# Patient Record
Sex: Male | Born: 1960
Health system: Southern US, Community
[De-identification: ages and names within clinical notes are randomized; demographics above are authoritative.]

## PROBLEM LIST (undated history)

## (undated) DIAGNOSIS — E7800 Pure hypercholesterolemia, unspecified: Secondary | ICD-10-CM

## (undated) DIAGNOSIS — I10 Essential (primary) hypertension: Secondary | ICD-10-CM

## (undated) DIAGNOSIS — E119 Type 2 diabetes mellitus without complications: Secondary | ICD-10-CM

## (undated) DIAGNOSIS — R7881 Bacteremia: Secondary | ICD-10-CM

## (undated) DIAGNOSIS — M25569 Pain in unspecified knee: Secondary | ICD-10-CM

## (undated) HISTORY — PX: WRIST FRACTURE SURGERY: SHX121

## (undated) HISTORY — PX: APPENDECTOMY: SHX54

---
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
## 2013-03-29 ENCOUNTER — Emergency Department (HOSPITAL_COMMUNITY)
Admission: EM | Admit: 2013-03-29 | Discharge: 2013-03-29 | Disposition: A | Discharge status: Home / Self Care | Payer: Self-pay | Attending: Emergency Medicine | Admitting: Emergency Medicine

## 2013-03-29 ENCOUNTER — Emergency Department (HOSPITAL_COMMUNITY): Payer: Self-pay

## 2013-03-29 ENCOUNTER — Encounter (HOSPITAL_COMMUNITY): Payer: Self-pay | Admitting: Emergency Medicine

## 2013-03-29 DIAGNOSIS — S43429A Sprain of unspecified rotator cuff capsule, initial encounter: Secondary | ICD-10-CM | POA: Insufficient documentation

## 2013-03-29 DIAGNOSIS — R079 Chest pain, unspecified: Secondary | ICD-10-CM

## 2013-03-29 DIAGNOSIS — E119 Type 2 diabetes mellitus without complications: Secondary | ICD-10-CM | POA: Insufficient documentation

## 2013-03-29 DIAGNOSIS — Y939 Activity, unspecified: Secondary | ICD-10-CM | POA: Insufficient documentation

## 2013-03-29 DIAGNOSIS — I10 Essential (primary) hypertension: Secondary | ICD-10-CM | POA: Insufficient documentation

## 2013-03-29 DIAGNOSIS — Y929 Unspecified place or not applicable: Secondary | ICD-10-CM | POA: Insufficient documentation

## 2013-03-29 DIAGNOSIS — K458 Other specified abdominal hernia without obstruction or gangrene: Secondary | ICD-10-CM | POA: Insufficient documentation

## 2013-03-29 DIAGNOSIS — X58XXXA Exposure to other specified factors, initial encounter: Secondary | ICD-10-CM | POA: Insufficient documentation

## 2013-03-29 DIAGNOSIS — Z87891 Personal history of nicotine dependence: Secondary | ICD-10-CM | POA: Insufficient documentation

## 2013-03-29 HISTORY — DX: Essential (primary) hypertension: I10

## 2013-03-29 HISTORY — DX: Pure hypercholesterolemia, unspecified: E78.00

## 2013-03-29 HISTORY — DX: Type 2 diabetes mellitus without complications: E11.9

## 2013-03-29 LAB — COMPREHENSIVE METABOLIC PANEL
ALBUMIN: 4.8 g/dL (ref 3.5–5.2)
ALT: 50 U/L (ref 0–53)
AST: 44 U/L — ABNORMAL HIGH (ref 0–37)
Alkaline Phosphatase: 79 U/L (ref 39–117)
BILIRUBIN TOTAL: 0.6 mg/dL (ref 0.3–1.2)
BUN: 10 mg/dL (ref 6–23)
CHLORIDE: 95 meq/L — AB (ref 96–112)
CO2: 23 mEq/L (ref 19–32)
Calcium: 9.5 mg/dL (ref 8.4–10.5)
Creatinine, Ser: 0.66 mg/dL (ref 0.50–1.35)
GFR calc non Af Amer: >90 mL/min (ref >90)
GLUCOSE: 221 mg/dL — AB (ref 70–99)
Potassium: 3.8 mEq/L (ref 3.7–5.3)
SODIUM: 135 meq/L — AB (ref 137–147)
Total Protein: 7.9 g/dL (ref 6.0–8.3)

## 2013-03-29 LAB — CBC WITH DIFFERENTIAL/PLATELET
BASOS ABS: 0 10*3/uL (ref 0.0–0.1)
BASOS PCT: 0 % (ref 0–1)
EOS PCT: 0 % (ref 0–5)
Eosinophils Absolute: 0 10*3/uL (ref 0.0–0.7)
HCT: 40.4 % (ref 39.0–52.0)
Hemoglobin: 14.6 g/dL (ref 13.0–17.0)
LYMPHS PCT: 11 % — AB (ref 12–46)
Lymphs Abs: 1.2 10*3/uL (ref 0.7–4.0)
MCH: 31.7 pg (ref 26.0–34.0)
MCHC: 36.1 g/dL — ABNORMAL HIGH (ref 30.0–36.0)
MCV: 87.8 fL (ref 78.0–100.0)
MONO ABS: 0.6 10*3/uL (ref 0.1–1.0)
Monocytes Relative: 5 % (ref 3–12)
Neutro Abs: 8.9 10*3/uL — ABNORMAL HIGH (ref 1.7–7.7)
Neutrophils Relative %: 83 % — ABNORMAL HIGH (ref 43–77)
Platelets: 204 10*3/uL (ref 150–400)
RBC: 4.6 MIL/uL (ref 4.22–5.81)
RDW: 12.3 % (ref 11.5–15.5)
WBC: 10.7 10*3/uL — AB (ref 4.0–10.5)

## 2013-03-29 LAB — POCT I-STAT TROPONIN I
Troponin i, poc: 0.01 ng/mL (ref 0.00–0.08)
Troponin i, poc: 0 ng/mL (ref 0.00–0.08)

## 2013-03-29 MED ORDER — ASPIRIN 81 MG PO CHEW
324.0000 mg | CHEWABLE_TABLET | Freq: Once | ORAL | Status: AC
  Administered 2013-03-29: 324 mg via ORAL
  Filled 2013-03-29: qty 4

## 2013-03-29 MED ORDER — IBUPROFEN 800 MG PO TABS: 800.0000 mg | ORAL_TABLET | Freq: Three times a day (TID) | ORAL | Status: DC

## 2013-03-29 MED ORDER — MORPHINE SULFATE 4 MG/ML IJ SOLN
4.0000 mg | Freq: Once | INTRAMUSCULAR | Status: AC
  Administered 2013-03-29: 4 mg via INTRAVENOUS
  Filled 2013-03-29: qty 1

## 2013-03-29 NOTE — ED Notes (Signed)
Attempted to draw istat troponin. Unsuccessful- notified phlebotomy.

## 2013-03-29 NOTE — ED Provider Notes (Signed)
CSN: [id number]     Arrival date & time 03/29/13  [id number] History   First MD Initiated Contact with Patient 03/29/13 [phone]     Chief Complaint  Patient presents with  . Chest Pain   (Consider location/radiation/quality/duration/timing/severity/associated sxs/prior Treatment) HPI Comments: Patient presents to the emergency department with chief complaint of chest pain that started this morning when he awoke around 6:00. States the pain is located on left side of his chest. He denies any associated shortness of breath, or diaphoresis. He states the pain was an 8/10, but is now 6/10. The pain is worsened with movement, and with palpation, but he denies any known mechanism of injury. Cardiac risk factors include hyperlipidemia, diabetes, hypertension, and family history, and smoking history. He states the pain has eased off since this morning. He states that he has had pain like this before, but it has been "quite a while." He reports having a stress test approximately 15 years ago. Additionally, he reports that he has had an abdominal hernia for several years, and also a rotator cuff injury. States that the shoulder causes him pain, and the hernia is a nuisance, but does not cause pain.  The history is provided by the patient. No language interpreter was used.    Past Medical History  Diagnosis Date  . Hypercholesteremia   . Diabetes mellitus without complication   . Hypertension    Past Surgical History  Procedure Laterality Date  . Appendectomy    . Wrist fracture surgery     History reviewed. No pertinent family history. History  Substance Use Topics  . Smoking status: Former Games developermoker  . Smokeless tobacco: Not on file  . Alcohol Use: Yes    Review of Systems  All other systems reviewed and are negative.    Allergies  Review of patient's allergies indicates no known allergies.  Home Medications  No current outpatient prescriptions on file. BP 162/106  Temp(Src) 97.7 F (36.5 C)  (Oral)  Resp 18  SpO2 98% Physical Exam  Nursing note and vitals reviewed. Constitutional: He is oriented to person, place, and time. He appears well-developed and well-nourished.  HENT:  Head: Normocephalic and atraumatic.  Eyes: Conjunctivae and EOM are normal. Pupils are equal, round, and reactive to light. Right eye exhibits no discharge. Left eye exhibits no discharge. No scleral icterus.  Neck: Normal range of motion. Neck supple. No JVD present.  Cardiovascular: Normal rate, regular rhythm, normal heart sounds and intact distal pulses.  Exam reveals no gallop and no friction rub.   No murmur heard. Pulmonary/Chest: Effort normal and breath sounds normal. No respiratory distress. He has no wheezes. He has no rales. He exhibits no tenderness.  Abdominal: Soft. He exhibits no distension and no mass. There is no tenderness. There is no rebound and no guarding.  Right lower quadrant remarkable for small abdominal hernia  Musculoskeletal: Normal range of motion. He exhibits no edema and no tenderness.  Neurological: He is alert and oriented to person, place, and time.  CN 3-12 intact  Skin: Skin is warm and dry.  Psychiatric: He has a normal mood and affect. His behavior is normal. Judgment and thought content normal.    ED Course  Procedures (including critical care time) Labs Review Labs Reviewed  COMPREHENSIVE METABOLIC PANEL - Abnormal; Notable for the following:    Sodium 135 (*)    Chloride 95 (*)    Glucose, Bld 221 (*)    AST 44 (*)    All other  components within normal limits  CBC WITH DIFFERENTIAL - Abnormal; Notable for the following:    WBC 10.7 (*)    MCHC 36.1 (*)    Neutrophils Relative % 83 (*)    Neutro Abs 8.9 (*)    Lymphocytes Relative 11 (*)    All other components within normal limits  POCT I-STAT TROPONIN I   Imaging Review No results found.  EKG Interpretation    Date/Time:  Tuesday March 29 2013 06:44:37 EST Ventricular Rate:  102 PR  Interval:  146 QRS Duration: 92 QT Interval:  346 QTC Calculation: 450 R Axis:   12 Text Interpretation:  Sinus tachycardia Otherwise normal ECG No old tracing to compare Confirmed by [facility]  MD, TREY ([id number]) on 03/29/2013 11:27:45 AM          12:30 PM ED ECG REPORT  I personally interpreted this EKG   Date: 03/29/2013   Rate: 72  Rhythm: normal sinus rhythm  QRS Axis: normal  Intervals: normal  ST/T Wave abnormalities: normal  Conduction Disutrbances:none  Narrative Interpretation:   Old EKG Reviewed: changes noted, tachycardia is resolved    MDM   1. Chest pain     Patient with chest pain. Initial labs are normal. Will treat with aspirin and morphine. Will check chest x-ray. Abdominal hernia and rotator cuff can't be treated on an outpatient basis. I discussed this with the patient.   12:31 PM  Labs are reassuring.  HEART score is 3.  Wells PE score is 1.5, doubt PE.  Pain is reproducible with palpation.  Believe this to be chest wall pain.  Delta troponin and repeat EKG are normal.  Discharge to home with cardiology follow-up.  Patient seen by and discussed with Dr. Loretha Stapler, who agrees with the plan.   Roxy Horseman, PA-C 03/29/13 1248

## 2013-03-29 NOTE — ED Notes (Signed)
Pt having chest pain 6/10. Denies SOB, dizziness, N/V

## 2013-03-29 NOTE — ED Notes (Addendum)
C/o CP, onset ~ 2 hrs ago, h/o htn, DM, hypercholesterol, denies other sx, "feels like a knot or a cramp", pinpoints to L chest w/o radiation. No meds PTA. Suppose to be on medicines, but do not have any. Not taking any meds.

## 2013-03-29 NOTE — ED Provider Notes (Signed)
Medical screening examination/treatment/procedure(s) were conducted as a shared visit with non-physician practitioner(s) and myself.  I personally evaluated the patient during the encounter.   Please see my separate note.     Candyce ChurnJohn David Vessie Olmsted, MD 03/29/13 [phone]

## 2013-03-29 NOTE — Discharge Instructions (Signed)
Chest Pain (Nonspecific) °It is often hard to give a specific diagnosis for the cause of chest pain. There is always a chance that your pain could be related to something serious, such as a heart attack or a blood clot in the lungs. You need to follow up with your caregiver for further evaluation. °CAUSES  °· Heartburn. °· Pneumonia or bronchitis. °· Anxiety or stress. °· Inflammation around your heart (pericarditis) or lung (pleuritis or pleurisy). °· A blood clot in the lung. °· A collapsed lung (pneumothorax). It can develop suddenly on its own (spontaneous pneumothorax) or from injury (trauma) to the chest. °· Shingles infection (herpes zoster virus). °The chest wall is composed of bones, muscles, and cartilage. Any of these can be the source of the pain. °· The bones can be bruised by injury. °· The muscles or cartilage can be strained by coughing or overwork. °· The cartilage can be affected by inflammation and become sore (costochondritis). °DIAGNOSIS  °Lab tests or other studies, such as X-rays, electrocardiography, stress testing, or cardiac imaging, may be needed to find the cause of your pain.  °TREATMENT  °· Treatment depends on what may be causing your chest pain. Treatment may include: °· Acid blockers for heartburn. °· Anti-inflammatory medicine. °· Pain medicine for inflammatory conditions. °· Antibiotics if an infection is present. °· You may be advised to change lifestyle habits. This includes stopping smoking and avoiding alcohol, caffeine, and chocolate. °· You may be advised to keep your head raised (elevated) when sleeping. This reduces the chance of acid going backward from your stomach into your esophagus. °· Most of the time, nonspecific chest pain will improve within 2 to 3 days with rest and mild pain medicine. °HOME CARE INSTRUCTIONS  °· If antibiotics were prescribed, take your antibiotics as directed. Finish them even if you start to feel better. °· For the next few days, avoid physical  activities that bring on chest pain. Continue physical activities as directed. °· Do not smoke. °· Avoid drinking alcohol. °· Only take over-the-counter or prescription medicine for pain, discomfort, or fever as directed by your caregiver. °· Follow your caregiver's suggestions for further testing if your chest pain does not go away. °· Keep any follow-up appointments you made. If you do not go to an appointment, you could develop lasting (chronic) problems with pain. If there is any problem keeping an appointment, you must call to reschedule. °SEEK MEDICAL CARE IF:  °· You think you are having problems from the medicine you are taking. Read your medicine instructions carefully. °· Your chest pain does not go away, even after treatment. °· You develop a rash with blisters on your chest. °SEEK IMMEDIATE MEDICAL CARE IF:  °· You have increased chest pain or pain that spreads to your arm, neck, jaw, back, or abdomen. °· You develop shortness of breath, an increasing cough, or you are coughing up blood. °· You have severe back or abdominal pain, feel nauseous, or vomit. °· You develop severe weakness, fainting, or chills. °· You have a fever. °THIS IS AN EMERGENCY. Do not wait to see if the pain will go away. Get medical help at once. Call your local emergency services (911 in [place] Do not drive yourself to the hospital. °MAKE SURE YOU:  °· Understand these instructions. °· Will watch your condition. °· Will get help right away if you are not doing well or get worse. °Document Released: 12/18/2004 Document Revised: 06/02/2011 Document Reviewed: 10/14/2007 °Exit[employer]® Patient Information ©2014 [employer]   [employer]. ° °

## 2013-03-29 NOTE — ED Provider Notes (Signed)
Medical screening examination/treatment/procedure(s) were conducted as a shared visit with non-physician practitioner(s) and myself.  I personally evaluated the patient during the encounter.  EKG Interpretation    Date/Time:  Tuesday March 29 2013 06:44:37 EST Ventricular Rate:  102 PR Interval:  146 QRS Duration: 92 QT Interval:  346 QTC Calculation: 450 R Axis:   12 Text Interpretation:  Sinus tachycardia Otherwise normal ECG No old tracing to compare Conf[facility] by [facility]  [facility], TREY ([id number]) on 03/29/2013 11:27:45 AM            53 yo male with onset of chest pain this morning.  Central, non radiating.  Not associated with SOB, nausea, diaphoresis, or lightheadedness.  On exam, well appearing, no distress, chest tenderness to palpation and with sitting up from lying position.  Lungs CTAB.  Heart sounds normal with RRR.  Has risk factors for ACS, but story more concerning for chest wall pain. Plan to rule out MI with delta troponin and have him follow up with cardiology for further testing.    Clinical Impression: 1. Chest pain       Carl ChurnJohn David Jaquae Rieves, MD 03/29/13 [phone]

## 2013-07-27 ENCOUNTER — Encounter (HOSPITAL_COMMUNITY): Payer: Self-pay | Admitting: Emergency Medicine

## 2013-07-27 ENCOUNTER — Emergency Department (HOSPITAL_COMMUNITY)
Admission: EM | Admit: 2013-07-27 | Discharge: 2013-07-27 | Disposition: A | Discharge status: Home / Self Care | Payer: Self-pay | Attending: Emergency Medicine | Admitting: Emergency Medicine

## 2013-07-27 DIAGNOSIS — S0502XA Injury of conjunctiva and corneal abrasion without foreign body, left eye, initial encounter: Secondary | ICD-10-CM

## 2013-07-27 DIAGNOSIS — X58XXXA Exposure to other specified factors, initial encounter: Secondary | ICD-10-CM | POA: Insufficient documentation

## 2013-07-27 DIAGNOSIS — S0010XA Contusion of unspecified eyelid and periocular area, initial encounter: Secondary | ICD-10-CM | POA: Insufficient documentation

## 2013-07-27 DIAGNOSIS — S058X9A Other injuries of unspecified eye and orbit, initial encounter: Secondary | ICD-10-CM | POA: Insufficient documentation

## 2013-07-27 DIAGNOSIS — Y929 Unspecified place or not applicable: Secondary | ICD-10-CM | POA: Insufficient documentation

## 2013-07-27 DIAGNOSIS — I10 Essential (primary) hypertension: Secondary | ICD-10-CM | POA: Insufficient documentation

## 2013-07-27 DIAGNOSIS — Y939 Activity, unspecified: Secondary | ICD-10-CM | POA: Insufficient documentation

## 2013-07-27 DIAGNOSIS — Z87891 Personal history of nicotine dependence: Secondary | ICD-10-CM | POA: Insufficient documentation

## 2013-07-27 DIAGNOSIS — Z791 Long term (current) use of non-steroidal anti-inflammatories (NSAID): Secondary | ICD-10-CM | POA: Insufficient documentation

## 2013-07-27 DIAGNOSIS — E119 Type 2 diabetes mellitus without complications: Secondary | ICD-10-CM | POA: Insufficient documentation

## 2013-07-27 MED ORDER — FLUORESCEIN SODIUM 1 MG OP STRP
1.0000 | ORAL_STRIP | Freq: Once | OPHTHALMIC | Status: AC
  Administered 2013-07-27: 1 via OPHTHALMIC
  Filled 2013-07-27: qty 1

## 2013-07-27 MED ORDER — GENTAMICIN SULFATE 0.3 % OP SOLN: 1.0000 [drp] | OPHTHALMIC | Status: DC

## 2013-07-27 MED ORDER — PROPARACAINE HCL 0.5 % OP SOLN
1.0000 [drp] | Freq: Once | OPHTHALMIC | Status: AC
  Administered 2013-07-27: 1 [drp] via OPHTHALMIC
  Filled 2013-07-27: qty 15

## 2013-07-27 NOTE — ED Notes (Signed)
PA at bedside.

## 2013-07-27 NOTE — Discharge Instructions (Signed)
Take tylenol and ibuprofen as needed for pain. You may alternate cool and warm moist compresses to eye to help ease pain. Use antibiotic drops as prescribed until symptoms improve.  If not improving in 2-3 days, follow up with ophthalmologist for further evaluation and treatment.

## 2013-07-27 NOTE — ED Notes (Signed)
Pt reports feels like he maybe got a pain chip in his eye; feels it moving around. Conjuctiva is reddened in left eye.

## 2013-07-27 NOTE — ED Notes (Signed)
Per pt sts that he thinks he either got paint chip or dust in eye yesterday. Left eye. sts he can feel it in there and unable to get out.

## 2013-07-27 NOTE — ED Notes (Signed)
PT ambulated with baseline gait; VSS; A&Ox3; no signs of distress; respirations even and unlabored; skin warm and dry; no questions upon discharge.  

## 2013-07-27 NOTE — ED Provider Notes (Signed)
CSN: [id number]     Arrival date & time 07/27/13  1023 History   First MD Initiated Contact with Patient 07/27/13 1230    This chart was scribed for Junius FinnerErin O'Malley PA-C, a non-physician practitioner working with No att. providers found by Lewanda RifeAlexandra Hurtado, ED Scribe. This patient was seen in room TR04C/TR04C and the patient's care was started at 1:01 PM     Chief Complaint  Patient presents with  . Eye Problem     (Consider location/radiation/quality/duration/timing/severity/associated sxs/prior Treatment) The history is provided by the patient. No language interpreter was used.   HPI Comments: Carl Baker is a 53 y.o. male who presents to the Emergency Department complaining of constant left eye pain onset yesterday while at work. Describes pain as moderate in severity. States there is a possible paint chip in eye. Reports associated foreign body sensation. Reports trying to Visine with mild relief of symptoms. Denies any aggravating factors. Denies associated fever, and other injuries. Pt does not wear contacts.    Past Medical History  Diagnosis Date  . Hypercholesteremia   . Diabetes mellitus without complication   . Hypertension    Past Surgical History  Procedure Laterality Date  . Appendectomy    . Wrist fracture surgery     History reviewed. No pertinent family history. History  Substance Use Topics  . Smoking status: Former Games developermoker  . Smokeless tobacco: Not on file  . Alcohol Use: Yes    Review of Systems  Constitutional: Negative for fever.  Eyes: Positive for pain, discharge and redness. Negative for photophobia.  Psychiatric/Behavioral: Negative for confusion.      Allergies  Review of patient's allergies indicates no known allergies.  Home Medications   Prior to Admission medications   Medication Sig Start Date End Date Taking? Authorizing Provider  ibuprofen (ADVIL,MOTRIN) 800 MG tablet Take 1 tablet (800 mg total) by mouth 3 (three) times daily.  03/29/13   Roxy Horsemanobert Browning, PA-C  Multiple Vitamins-Minerals (CENTRUM SILVER ADULT 50+) TABS Take 1 tablet by mouth daily.    Historical Provider, MD   BP 132/80  Pulse 79  Temp(Src) 98.3 F (36.8 C)  Resp 20  Wt 240 lb (108.863 kg)  SpO2 99% Physical Exam  Nursing note and vitals reviewed. Constitutional: He is oriented to person, place, and time. He appears well-developed and well-nourished. No distress.  HENT:  Head: Normocephalic and atraumatic.  Eyes: EOM are normal. Pupils are equal, round, and reactive to light. Left eye exhibits no discharge. No foreign body present in the left eye. Left conjunctiva is injected.  Slit lamp exam:      The left eye shows corneal abrasion and fluorescein uptake. The left eye shows no corneal ulcer.    Visual Acuity Right Eye Distance: 20/40 Left Eye Distance: 20/40 Both eyes: 20/40  Neck: Neck supple. No tracheal deviation present.  Cardiovascular: Normal rate.   Pulmonary/Chest: Effort normal. No respiratory distress.  Musculoskeletal: Normal range of motion.  Neurological: He is alert and oriented to person, place, and time.  Skin: Skin is warm and dry.  Psychiatric: He has a normal mood and affect. His behavior is normal.    ED Course  Procedures (including critical care time) COORDINATION OF CARE:  Nursing notes reviewed. Vital signs reviewed. Initial pt interview and examination performed.   Filed Vitals:   07/27/13 1033 07/27/13 1316  BP: 138/78 132/80  Pulse: 79 79  Temp: 98.3 F (36.8 C)   Resp: 18 20  Weight: 240 lb (108.863  kg)   SpO2: 95% 99%    1:05 PM-Discussed work up plan with pt at bedside, which includes fluorescein stain and slit lamp. No orders of the defined types were placed in this encounter.  . Pt agrees with plan.   Treatment plan initiated: Medications  fluorescein ophthalmic strip 1 strip (1 strip Both Eyes Given 07/27/13 1301)  proparacaine (ALCAINE) 0.5 % ophthalmic solution 1 drop (1 drop Both Eyes  Given 07/27/13 1301)     Initial diagnostic testing ordered.       Labs Review Labs Reviewed - No data to display  Imaging Review No results found.   EKG Interpretation None      MDM   Final diagnoses:  Injury of conjunctiva and corneal abrasion of left eye w/o FB    Pt c/o FB sensation in eye, no FB seen on exam. Eye flushed. Will tx for corneal abrasion. Rx: gentamicin. Advised to f/u with ophthalmology if not improving in 2-3 days. Return precautions provided. Pt verbalized understanding and agreement with tx plan.   I personally performed the services described in this documentation, which was scribed in my presence. The recorded information has been reviewed and is accurate.    Junius Finnerrin O'Malley, PA-C 07/28/13 1008

## 2013-07-29 NOTE — ED Provider Notes (Signed)
Medical screening examination/treatment/procedure(s) were performed by non-physician practitioner and as supervising physician I was immediately available for consultation/collaboration.   EKG Interpretation None        Laray AngerKathleen M Malissa Slay, DO 07/29/13 2102

## 2018-10-08 ENCOUNTER — Encounter (HOSPITAL_COMMUNITY): Payer: Self-pay | Admitting: Emergency Medicine

## 2018-10-08 ENCOUNTER — Inpatient Hospital Stay (HOSPITAL_COMMUNITY)
Admission: EM | Admit: 2018-10-08 | Discharge: 2018-10-13 | DRG: 872 | Disposition: A | Discharge status: Home Health | Payer: Self-pay | Attending: Internal Medicine | Admitting: Internal Medicine

## 2018-10-08 ENCOUNTER — Emergency Department (HOSPITAL_COMMUNITY): Payer: Self-pay

## 2018-10-08 ENCOUNTER — Other Ambulatory Visit: Payer: Self-pay

## 2018-10-08 DIAGNOSIS — R7881 Bacteremia: Secondary | ICD-10-CM

## 2018-10-08 DIAGNOSIS — R159 Full incontinence of feces: Secondary | ICD-10-CM | POA: Diagnosis present

## 2018-10-08 DIAGNOSIS — Z79899 Other long term (current) drug therapy: Secondary | ICD-10-CM

## 2018-10-08 DIAGNOSIS — E119 Type 2 diabetes mellitus without complications: Secondary | ICD-10-CM

## 2018-10-08 DIAGNOSIS — M25569 Pain in unspecified knee: Secondary | ICD-10-CM

## 2018-10-08 DIAGNOSIS — Z20828 Contact with and (suspected) exposure to other viral communicable diseases: Secondary | ICD-10-CM | POA: Diagnosis present

## 2018-10-08 DIAGNOSIS — E1165 Type 2 diabetes mellitus with hyperglycemia: Secondary | ICD-10-CM | POA: Diagnosis present

## 2018-10-08 DIAGNOSIS — Z7984 Long term (current) use of oral hypoglycemic drugs: Secondary | ICD-10-CM

## 2018-10-08 DIAGNOSIS — Z87891 Personal history of nicotine dependence: Secondary | ICD-10-CM

## 2018-10-08 DIAGNOSIS — M609 Myositis, unspecified: Secondary | ICD-10-CM | POA: Diagnosis present

## 2018-10-08 DIAGNOSIS — I313 Pericardial effusion (noninflammatory): Secondary | ICD-10-CM | POA: Diagnosis present

## 2018-10-08 DIAGNOSIS — E785 Hyperlipidemia, unspecified: Secondary | ICD-10-CM | POA: Diagnosis present

## 2018-10-08 DIAGNOSIS — N410 Acute prostatitis: Secondary | ICD-10-CM | POA: Diagnosis present

## 2018-10-08 DIAGNOSIS — Z9114 Patient's other noncompliance with medication regimen: Secondary | ICD-10-CM

## 2018-10-08 DIAGNOSIS — N401 Enlarged prostate with lower urinary tract symptoms: Secondary | ICD-10-CM | POA: Diagnosis present

## 2018-10-08 DIAGNOSIS — N3001 Acute cystitis with hematuria: Secondary | ICD-10-CM | POA: Diagnosis present

## 2018-10-08 DIAGNOSIS — S83281A Other tear of lateral meniscus, current injury, right knee, initial encounter: Secondary | ICD-10-CM | POA: Diagnosis present

## 2018-10-08 DIAGNOSIS — E860 Dehydration: Secondary | ICD-10-CM | POA: Diagnosis present

## 2018-10-08 DIAGNOSIS — B9562 Methicillin resistant Staphylococcus aureus infection as the cause of diseases classified elsewhere: Secondary | ICD-10-CM

## 2018-10-08 DIAGNOSIS — A4102 Sepsis due to Methicillin resistant Staphylococcus aureus: Principal | ICD-10-CM | POA: Diagnosis present

## 2018-10-08 DIAGNOSIS — I10 Essential (primary) hypertension: Secondary | ICD-10-CM | POA: Diagnosis present

## 2018-10-08 DIAGNOSIS — R824 Acetonuria: Secondary | ICD-10-CM | POA: Diagnosis present

## 2018-10-08 DIAGNOSIS — K6289 Other specified diseases of anus and rectum: Secondary | ICD-10-CM | POA: Diagnosis present

## 2018-10-08 DIAGNOSIS — R338 Other retention of urine: Secondary | ICD-10-CM | POA: Diagnosis present

## 2018-10-08 DIAGNOSIS — E871 Hypo-osmolality and hyponatremia: Secondary | ICD-10-CM | POA: Diagnosis present

## 2018-10-08 DIAGNOSIS — E7800 Pure hypercholesterolemia, unspecified: Secondary | ICD-10-CM | POA: Diagnosis present

## 2018-10-08 LAB — COMPREHENSIVE METABOLIC PANEL
ALT: 22 U/L (ref 0–44)
AST: 15 U/L (ref 15–41)
Albumin: 2.5 g/dL — ABNORMAL LOW (ref 3.5–5.0)
Alkaline Phosphatase: 100 U/L (ref 38–126)
Anion gap: 13 (ref 5–15)
BUN: 12 mg/dL (ref 6–20)
CO2: 19 mmol/L — ABNORMAL LOW (ref 22–32)
Calcium: 8.8 mg/dL — ABNORMAL LOW (ref 8.9–10.3)
Chloride: 96 mmol/L — ABNORMAL LOW (ref 98–111)
Creatinine, Ser: 0.89 mg/dL (ref 0.61–1.24)
GFR calc Af Amer: >60 mL/min (ref >60)
GFR calc non Af Amer: >60 mL/min (ref >60)
Glucose, Bld: 437 mg/dL — ABNORMAL HIGH (ref 70–99)
Potassium: 4.3 mmol/L (ref 3.5–5.1)
Sodium: 128 mmol/L — ABNORMAL LOW (ref 135–145)
Total Bilirubin: 0.9 mg/dL (ref 0.3–1.2)
Total Protein: 6.8 g/dL (ref 6.5–8.1)

## 2018-10-08 LAB — HEMOGLOBIN A1C
Hgb A1c MFr Bld: 12.8 % — ABNORMAL HIGH (ref 4.8–5.6)
Mean Plasma Glucose: 320.66 mg/dL

## 2018-10-08 LAB — CBC WITH DIFFERENTIAL/PLATELET
Abs Immature Granulocytes: 0.27 10*3/uL — ABNORMAL HIGH (ref 0.00–0.07)
Basophils Absolute: 0.1 10*3/uL (ref 0.0–0.1)
Basophils Relative: 0 %
Eosinophils Absolute: 0 10*3/uL (ref 0.0–0.5)
Eosinophils Relative: 0 %
HCT: 43.1 % (ref 39.0–52.0)
Hemoglobin: 14.4 g/dL (ref 13.0–17.0)
Immature Granulocytes: 1 %
Lymphocytes Relative: 4 %
Lymphs Abs: 1.1 10*3/uL (ref 0.7–4.0)
MCH: 29.8 pg (ref 26.0–34.0)
MCHC: 33.4 g/dL (ref 30.0–36.0)
MCV: 89.2 fL (ref 80.0–100.0)
Monocytes Absolute: 1.2 10*3/uL — ABNORMAL HIGH (ref 0.1–1.0)
Monocytes Relative: 5 %
Neutro Abs: 22.7 10*3/uL — ABNORMAL HIGH (ref 1.7–7.7)
Neutrophils Relative %: 90 %
Platelets: 266 10*3/uL (ref 150–400)
RBC: 4.83 MIL/uL (ref 4.22–5.81)
RDW: 12.4 % (ref 11.5–15.5)
WBC: 25.4 10*3/uL — ABNORMAL HIGH (ref 4.0–10.5)
nRBC: 0 % (ref 0.0–0.2)

## 2018-10-08 LAB — GLUCOSE, CAPILLARY
Glucose-Capillary: 305 mg/dL — ABNORMAL HIGH (ref 70–99)
Glucose-Capillary: 291 mg/dL — ABNORMAL HIGH (ref 70–99)

## 2018-10-08 LAB — URINALYSIS, ROUTINE W REFLEX MICROSCOPIC
Bacteria, UA: NONE SEEN
Bilirubin Urine: NEGATIVE
Glucose, UA: >=500 mg/dL — AB
Ketones, ur: 80 mg/dL — AB
Nitrite: NEGATIVE
Protein, ur: 30 mg/dL — AB
Specific Gravity, Urine: 1.028 (ref 1.005–1.030)
pH: 6 (ref 5.0–8.0)

## 2018-10-08 LAB — SARS CORONAVIRUS 2 BY RT PCR (HOSPITAL ORDER, PERFORMED IN ~~LOC~~ HOSPITAL LAB): SARS Coronavirus 2: NEGATIVE

## 2018-10-08 LAB — LACTIC ACID, PLASMA: Lactic Acid, Venous: 1.7 mmol/L (ref 0.5–1.9)

## 2018-10-08 MED ORDER — SODIUM CHLORIDE 0.9 % IV SOLN: 2.0000 g | INTRAVENOUS | Status: DC

## 2018-10-08 MED ORDER — ACETAMINOPHEN 650 MG RE SUPP: 650.0000 mg | Freq: Four times a day (QID) | RECTAL | Status: DC | PRN

## 2018-10-08 MED ORDER — OXYCODONE HCL 5 MG PO TABS
5.0000 mg | ORAL_TABLET | ORAL | Status: DC | PRN
  Administered 2018-10-08 – 2018-10-13 (×12): 5 mg via ORAL
  Filled 2018-10-08 (×14): qty 1

## 2018-10-08 MED ORDER — SODIUM CHLORIDE 0.9 % IV SOLN
INTRAVENOUS | Status: DC
  Administered 2018-10-08 – 2018-10-10 (×6): via INTRAVENOUS

## 2018-10-08 MED ORDER — HYDROMORPHONE HCL 1 MG/ML IJ SOLN
1.0000 mg | Freq: Once | INTRAMUSCULAR | Status: AC
  Administered 2018-10-08: 12:00:00 1 mg via INTRAVENOUS
  Filled 2018-10-08: qty 1

## 2018-10-08 MED ORDER — ONDANSETRON HCL 4 MG PO TABS: 4.0000 mg | ORAL_TABLET | Freq: Four times a day (QID) | ORAL | Status: DC | PRN

## 2018-10-08 MED ORDER — ONDANSETRON HCL 4 MG/2ML IJ SOLN: 4.0000 mg | Freq: Four times a day (QID) | INTRAMUSCULAR | Status: DC | PRN

## 2018-10-08 MED ORDER — SODIUM CHLORIDE 0.9 % IV BOLUS
1000.0000 mL | Freq: Once | INTRAVENOUS | Status: AC
  Administered 2018-10-08: 14:00:00 1000 mL via INTRAVENOUS

## 2018-10-08 MED ORDER — HYDROMORPHONE HCL 1 MG/ML IJ SOLN
1.0000 mg | Freq: Once | INTRAMUSCULAR | Status: AC
  Administered 2018-10-08: 14:00:00 1 mg via INTRAVENOUS
  Filled 2018-10-08: qty 1

## 2018-10-08 MED ORDER — INSULIN ASPART 100 UNIT/ML ~~LOC~~ SOLN
0.0000 [IU] | Freq: Every day | SUBCUTANEOUS | Status: DC
  Administered 2018-10-08 – 2018-10-09 (×2): 5 [IU] via SUBCUTANEOUS
  Administered 2018-10-12: 2 [IU] via SUBCUTANEOUS

## 2018-10-08 MED ORDER — ACETAMINOPHEN 325 MG PO TABS
650.0000 mg | ORAL_TABLET | Freq: Four times a day (QID) | ORAL | Status: DC | PRN
  Administered 2018-10-09 – 2018-10-11 (×2): 650 mg via ORAL
  Filled 2018-10-08 (×2): qty 2

## 2018-10-08 MED ORDER — LISINOPRIL 20 MG PO TABS
20.0000 mg | ORAL_TABLET | Freq: Every day | ORAL | Status: DC
  Administered 2018-10-08 – 2018-10-12 (×5): 20 mg via ORAL
  Filled 2018-10-08 (×5): qty 1

## 2018-10-08 MED ORDER — KETOROLAC TROMETHAMINE 30 MG/ML IJ SOLN
30.0000 mg | Freq: Four times a day (QID) | INTRAMUSCULAR | Status: DC | PRN
  Administered 2018-10-10 – 2018-10-12 (×5): 30 mg via INTRAVENOUS
  Filled 2018-10-08 (×6): qty 1

## 2018-10-08 MED ORDER — ENOXAPARIN SODIUM 40 MG/0.4ML ~~LOC~~ SOLN
40.0000 mg | SUBCUTANEOUS | Status: DC
  Administered 2018-10-08 – 2018-10-12 (×5): 40 mg via SUBCUTANEOUS
  Filled 2018-10-08 (×5): qty 0.4

## 2018-10-08 MED ORDER — SODIUM CHLORIDE 0.9 % IV SOLN
2.0000 g | Freq: Once | INTRAVENOUS | Status: AC
  Administered 2018-10-08: 2 g via INTRAVENOUS
  Filled 2018-10-08: qty 20

## 2018-10-08 MED ORDER — INSULIN ASPART 100 UNIT/ML ~~LOC~~ SOLN
0.0000 [IU] | Freq: Three times a day (TID) | SUBCUTANEOUS | Status: DC
  Administered 2018-10-08: 11 [IU] via SUBCUTANEOUS
  Administered 2018-10-09: 5 [IU] via SUBCUTANEOUS
  Administered 2018-10-09 (×2): 11 [IU] via SUBCUTANEOUS
  Administered 2018-10-10: 5 [IU] via SUBCUTANEOUS
  Administered 2018-10-10: 8 [IU] via SUBCUTANEOUS
  Administered 2018-10-10: 5 [IU] via SUBCUTANEOUS
  Administered 2018-10-11 – 2018-10-12 (×4): 3 [IU] via SUBCUTANEOUS
  Administered 2018-10-12: 2 [IU] via SUBCUTANEOUS
  Administered 2018-10-13: 13:00:00 3 [IU] via SUBCUTANEOUS

## 2018-10-08 NOTE — Progress Notes (Signed)
Thelbert Gartin [id number]  Code Status: FULL Admission Data: 10/08/2018 7:47 PM  Attending Provider: Lorin Mercy  VEH:[id number], No Pcp Per  Consults/ Treatment Team:   Robben Jagiello is a 58 y.o. male patient admitted from ED awake, alert - oriented X 4 - no acute distress noted. VSS - Blood pressure (!) 144/77, pulse (!) 114, temperature 99.3 F (37.4 C), temperature source Oral, resp. rate 17, height 6\' 2"  (1.88 m), weight 105.2 kg, SpO2 97 %. no c/o shortness of breath, no c/o chest pain. Orientation to room, and floor completed with information packet given to patient/family. Admission INP armband ID verified with patient/family, and in place.  SR up x 2, fall assessment complete, with patient and family able to verbalize understanding of risk associated with falls, and verbalized understanding to call nsg before up out of bed. Call light within reach, patient able to voice, and demonstrate understanding. Skin, clean-dry- intact without evidence of bruising, or skin tears. Small bumps noted to both underarms. No evidence of skin break down noted on exam.  ?  Will cont to eval and treat per MD orders.  Melonie Florida, RN  10/08/2018 7:47 PM

## 2018-10-08 NOTE — ED Notes (Signed)
[place] pt contact.

## 2018-10-08 NOTE — ED Provider Notes (Signed)
[facility]GENCY [facility] Provider Note   CSN: [id number] Arrival date & time: 10/08/18  [id number]    History   Chief Complaint Chief Complaint  Patient presents with   Fall   Urinary Incontinence   Encopresis    HPI Carl Baker is a [age] y.o. male.     58 y.o male with a PMH of DM, HTN, Hypercholesteremia presents to the ED with a chief complaint of fall. Patient had a fall about 2 weeks ago, reports he hit his shin on the back of a truck causing him to spin around and landed on his buttocks region.  He reports pain to his right knee, worse with ambulation.  Also endorses pain along the left lower back reports the radiation of pain into his penis, states "like a bar wires on my penis when I urinate".  Patient was seen by his PCP 2 weeks ago, told there was some blood in his urine and prescribed Cipro which she has completed therapy.  He reports having some episodes of urinary incontinence, states he stands up to urinate but is unable to control himself, he also reports he has had some loose stools, states the pain is worse with urination.  He denies any fever, scrotal swelling, bowel incontinence.   The history is provided by the patient.    Past Medical History:  Diagnosis Date   Diabetes mellitus without complication (HCC)    Hypercholesteremia    Hypertension     There are no active problems to display for this patient.   Past Surgical History:  Procedure Laterality Date   APPENDECTOMY     WRIST FRACTURE SURGERY          Home Medications    Prior to Admission medications   Medication Sig Start Date End Date Taking? Authorizing Provider  acetaminophen (TYLENOL) 500 MG tablet Take 1,000 mg by mouth every 6 (six) hours as needed for mild pain.   Yes [provider]  ciprofloxacin (CIPRO) 500 MG tablet Take 500 mg by mouth 2 (two) times daily. 10/04/18  Yes [provider]  glimepiride (AMARYL) 4 MG tablet Take 4 mg by  mouth 2 (two) times daily. 10/04/18  Yes [provider]  lisinopril (ZESTRIL) 20 MG tablet Take 20 mg by mouth daily. 10/04/18  Yes [provider]  Multiple Vitamins-Minerals (CENTRUM SILVER ADULT 50+) TABS Take 1 tablet by mouth daily.   Yes [provider]  pioglitazone (ACTOS) 30 MG tablet Take 30 mg by mouth daily. 10/04/18  Yes [provider]  gentamicin (GARAMYCIN) 0.3 % ophthalmic solution Place 1 drop into the left eye every 4 (four) hours. Patient not taking: Reported on 10/08/2018 07/27/13   Lurene ShadowPhelps, Erin O, PA-C  ibuprofen (ADVIL,MOTRIN) 800 MG tablet Take 1 tablet (800 mg total) by mouth 3 (three) times daily. Patient not taking: Reported on 10/08/2018 03/29/13   Roxy HorsemanBrowning, Robert, PA-C    Family History No family history on file.  Social History Social History   Tobacco Use   Smoking status: Former Smoker  Substance Use Topics   Alcohol use: Yes   Drug use: Yes    Types: Marijuana     Allergies   Patient has no known allergies.   Review of Systems Review of Systems  Constitutional: Negative for chills and fever.  HENT: Negative for ear pain and sore throat.   Eyes: Negative for pain and visual disturbance.  Respiratory: Negative for cough and shortness of breath.  Cardiovascular: Negative for chest pain and palpitations.  Gastrointestinal: Negative for abdominal pain and vomiting.  Genitourinary: Negative for dysuria and hematuria.  Musculoskeletal: Positive for arthralgias, back pain and myalgias.  Skin: Negative for color change and rash.  Neurological: Negative for seizures and syncope.  All other systems reviewed and are negative.    Physical Exam Updated Vital Signs BP (!) 158/90    Pulse (!) 110    Temp 98.1 F (36.7 C) (Oral)    Resp 16    Ht 6\' 2"  (1.88 m)    Wt 105.2 kg    SpO2 96%    BMI 29.79 kg/m   Physical Exam Vitals signs and nursing note reviewed.  Constitutional:      Appearance: He is well-developed.    HENT:     Head: Normocephalic and atraumatic.  Eyes:     General: No scleral icterus.    Pupils: Pupils are equal, round, and reactive to light.  Neck:     Musculoskeletal: Normal range of motion.  Cardiovascular:     Heart sounds: Normal heart sounds.  Pulmonary:     Effort: Pulmonary effort is normal.     Breath sounds: Normal breath sounds. No wheezing.  Chest:     Chest wall: No tenderness.  Abdominal:     General: Bowel sounds are normal. There is no distension.     Palpations: Abdomen is soft.     Tenderness: There is no abdominal tenderness.  Musculoskeletal:        General: No tenderness or deformity.     Thoracic back: He exhibits pain and spasm.       Back:  Skin:    General: Skin is warm and dry.  Neurological:     Mental Status: He is alert and oriented to person, place, and time.     Comments: RLE- KF,KE 5/5 strength LLE- HF, HE 5/5 strength Normal gait. No pronator drift. No leg drop.  Patellar reflexes present and symmetric. CN I, II and VIII not tested. CN II-XII grossly intact bilaterally.         ED Treatments / Results  Labs (all labs ordered are listed, but only abnormal results are displayed) Labs Reviewed  CBC WITH DIFFERENTIAL/PLATELET - Abnormal; Notable for the following components:      Result Value   WBC 25.4 (*)    Neutro Abs 22.7 (*)    Monocytes Absolute 1.2 (*)    Abs Immature Granulocytes 0.27 (*)    All other components within normal limits  COMPREHENSIVE METABOLIC PANEL - Abnormal; Notable for the following components:   Sodium 128 (*)    Chloride 96 (*)    CO2 19 (*)    Glucose, Bld 437 (*)    Calcium 8.8 (*)    Albumin 2.5 (*)    All other components within normal limits  URINALYSIS, ROUTINE W REFLEX MICROSCOPIC - Abnormal; Notable for the following components:   Glucose, UA >=500 (*)    Hgb urine dipstick SMALL (*)    Ketones, ur 80 (*)    Protein, ur 30 (*)    Leukocytes,Ua TRACE (*)    All other components within  normal limits  CULTURE, BLOOD (ROUTINE X 2)  CULTURE, BLOOD (ROUTINE X 2)  SARS CORONAVIRUS 2 (HOSPITAL ORDER, [place] [facility])  URINE CULTURE  SARS CORONAVIRUS 2 (HOSPITAL ORDER, [place] [facility])  LACTIC ACID, PLASMA  LACTIC ACID, PLASMA    EKG None  Radiology Ct Renal Stone Study  Result Date: 10/08/2018 CLINICAL DATA:  Mid back pain for the past 3 weeks. EXAM: CT ABDOMEN AND PELVIS WITHOUT CONTRAST TECHNIQUE: Multidetector CT imaging of the abdomen and pelvis was performed following the standard protocol without IV contrast. COMPARISON:  None. FINDINGS: Lower chest: Minimal pericardial effusion with a maximum thickness of 7 mm. Normal sized heart. Clear lung bases. Hepatobiliary: No focal liver abnormality is seen. No gallstones, gallbladder wall thickening, or biliary dilatation. Pancreas: Unremarkable. No pancreatic ductal dilatation or surrounding inflammatory changes. Spleen: Normal in size without focal abnormality. Adrenals/Urinary Tract: Normal appearing adrenal glands. Mild bilateral perinephric soft tissue stranding extending into the upper pelvis. Unremarkable ureters and urinary bladder. No urinary tract calculi or hydronephrosis seen. Stomach/Bowel: Multiple colonic diverticula. Moderate diffuse low-density rectal wall thickening with extensive perirectal soft tissue stranding and mild presacral edema. The wall thickening extends into the distal sigmoid colon, less prominent in the distal sigmoid colon compared to the rectum. Surgically absent appendix. Unremarkable stomach and small bowel. Vascular/Lymphatic: Atheromatous arterial calcifications without aneurysm. No enlarged lymph nodes. Reproductive: Moderate-to-marked diffuse enlargement of the prostate gland and seminal vesicles with mild heterogeneity and extensive surrounding soft tissue stranding. Other: Small umbilical hernia containing fat, extending into the supraumbilical region.  Musculoskeletal: Lumbar and lower thoracic spine degenerative changes. IMPRESSION: 1. Moderate-to-marked diffuse enlargement of the prostate gland and seminal vesicles with mild heterogeneity and extensive surrounding soft tissue stranding, compatible with marked acute prostatitis. 2. Changes of acute proctitis in distal sigmoid colitis. This most likely secondary to the adjacent marked changes of acute prostatitis. Proctitis and distal colitis secondarily affecting the prostate gland and seminal vesicles is also a possibility. 3. Extensive colonic diverticulosis. 4. Minimal pericardial effusion. 5. Small umbilical hernia containing fat. Electronically Signed   By: Beckie SaltsSteven  Reid M.D.   On: 10/08/2018 13:23    Procedures Procedures (including critical care time)  Medications Ordered in ED Medications  cefTRIAXone (ROCEPHIN) 2 g in sodium chloride 0.9 % 100 mL IVPB (has no administration in time range)  HYDROmorphone (DILAUDID) injection 1 mg (1 mg Intravenous Given 10/08/18 1131)  HYDROmorphone (DILAUDID) injection 1 mg (1 mg Intravenous Given 10/08/18 1424)  sodium chloride 0.9 % bolus 1,000 mL (1,000 mLs Intravenous New Bag/Given 10/08/18 1424)     Initial Impression / Assessment and Plan / ED Course  I have reviewed the triage vital signs and the nursing notes.  Pertinent labs & imaging results that were available during my care of the patient were reviewed by me and considered in my medical decision making (see chart for details).  Clinical Course as of Oct 07 1440  Fri Oct 08, 2018  1253 Glucose(!): 437 [JS]  1414 Lymphocytes: 4 [JS]    Clinical Course User Index [JS] Carl Baker, Carl Yinger, PA-C      Patient with a past medical history of diabetes presents to the ED with left back pain after a fall with radiation to his groin region and testicular region.  Patient reports he was seen by his PCP, given Cipro therapy to help with his symptoms, has completed this therapy without improvement.  He  reports pain prior to voiding, states he has urinary urgency.  Has not been taking any medication for improvement in symptoms aside from the antibiotics.  During primary evaluation patient is neurologically intact, does have some pain with movement of the left leg.  Some pain noted also to his right knee, along with some swelling.  CBC was remarkable for a  leukocytosis of 25.4.  Hemoglobin is within normal limits.  CMP showed some hyponatremia, potassium is within normal limits.  Creatinine level is unremarkable.  Glucose remarkable for a level of 437, he does have a previous history of diabetes, reports compliance with medication.  UA was remarkable for trace of leukocytes, 21-50 white blood cell count.  Nitrite negative.  Will obtain CT renal to further evaluate patient's complaint.  Vital signs otherwise stable, heart rate slightly elevated.  CT renal stone study showed: 1. Moderate-to-marked diffuse enlargement of the prostate gland and  seminal vesicles with mild heterogeneity and extensive surrounding  soft tissue stranding, compatible with marked acute prostatitis.  2. Changes of acute proctitis in distal sigmoid colitis. This most  likely secondary to the adjacent marked changes of acute  prostatitis. Proctitis and distal colitis secondarily affecting the  prostate gland and seminal vesicles is also a possibility.  3. Extensive colonic diverticulosis.  4. Minimal pericardial effusion.  5. Small umbilical hernia containing fat.       Of note, patient has completed 1 week course of ciprofloxacin.  At this time will obtain blood cultures along with provide him with some ceftriaxone IV.  2:20 PM Spoke to Urology Dr. Marlou PorchHerrick who advised patient to be treated for month of antibiotics with Bactrim.  You also need a prostate exam.  A urine culture along with follow-up with urology.  Due to patient's leukocytosis, failed outpatient therapy along with elevation in heart rate will admit to  hospitalist for further management.  Given 2 g IV of Rocephin.   2:42 PM Spoke to Dr. Ophelia CharterYates who will admit patient for further management of his infection.  Portions of this note were generated with Scientist, clinical (histocompatibility and immunogenetics)Dragon dictation software. Dictation errors may occur despite best attempts at proofreading.   Final Clinical Impressions(s) / ED Diagnoses   Final diagnoses:  Acute prostatitis  Acute cystitis with hematuria    ED Discharge Orders    None       Carl Baker, Shanik Brookshire, PA-C 10/08/18 1443    Pricilla LovelessGoldston, Scott, MD 10/12/18 [phone]

## 2018-10-08 NOTE — ED Notes (Signed)
Pt transported to CT ?

## 2018-10-08 NOTE — H&P (Signed)
History and Physical    Carl Baker [id number] DOB: 05/10/1960 DOA: 10/08/2018  PCP: Morton Amyarr, [place], [place] Consultants:  None Patient coming from:  Home - lives alone; NOK: Verlon AuLeslie, friend, [phone]  Chief Complaint:  Bowel/bladder incontinence  HPI: Carl OmsClarence Sontag is a 58 y.o. male with medical history significant of HTN; HLD; and DM presenting with bowel/bladder incontinence.  He couldn't hold his pee and now he can't pee.  Symptoms started Monday 2 1/2 weeks ago.  He completed 4 days of antibiotics without improvement.  His urine is orange, concerning for blood in the urine.  Sexually active until about a month ago, 1 male partner.  +fever, taking Tylenol XS for pain.  It burns "like fire" when he voids.  He goes to pee and ends up "pooping on myself."  +urinary incontinence.  Then it became "just miserable trying to pee."  Denies h/o prior.   ED Course:  Larey SeatFell about 3 weeks ago, landed on buttocks.  PCP saw 2 weeks ago, placed on Cipro.  Ongoing pain with radiation into the penis.  UA with bacteria, trace LE after a week of Cipro.  CT with severe prostatitic, failed outpatient therapy.  Given IV Rocephin, IVF, Dilaudid.    Review of Systems: As per HPI; otherwise review of systems reviewed and negative.   Ambulatory Status:  Ambulates without assistance  Past Medical History:  Diagnosis Date  . Diabetes mellitus without complication (HCC)   . Hypercholesteremia   . Hypertension     Past Surgical History:  Procedure Laterality Date  . APPENDECTOMY    . WRIST FRACTURE SURGERY      Social History   Socioeconomic History  . Marital status: Single    Spouse name: Not on file  . Number of children: Not on file  . Years of education: Not on file  . Highest education level: Not on file  Occupational History  . 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 Financial resource strain: Not on file  . Food insecurity    Worry: Not on file    Inability: Not on file  .  Transportation needs    Medical: Not on file    Non-medical: Not on file  Tobacco Use  . Smoking status: Former Smoker    Quit date: 1992    Years since quitting: 28.5  . Smokeless tobacco: Never Used  Substance and Sexual Activity  . Alcohol use: Yes    Comment: none recently; usually with buddies, maybe total of 12 since COVID started  . Drug use: Never    Types: Marijuana    Comment: quit about 6-7 years ago  . Sexual activity: Not on file  Lifestyle  . Physical activity    Days per week: Not on file    Minutes per session: Not on file  . Stress: Not on file  Relationships  . Social Musicianconnections    Talks on phone: Not on file    Gets together: Not on file    Attends religious service: Not on file    Active member of club or organization: Not on file    Attends meetings of clubs or organizations: Not on file    Relationship status: Not on file  . Intimate partner violence    Fear of current or ex partner: Not on file    Emotionally abused: Not on file    Physically abused: Not on file    Forced sexual activity: Not on file  Other Topics Concern  .  Not on file  Social History Narrative  . Not on file    No Known Allergies  History reviewed. No pertinent family history.  Prior to Admission medications   Medication Sig Start Date End Date Taking? Authorizing Provider  acetaminophen (TYLENOL) 500 MG tablet Take 1,000 mg by mouth every 6 (six) hours as needed for mild pain.   Yes [provider]  ciprofloxacin (CIPRO) 500 MG tablet Take 500 mg by mouth 2 (two) times daily. 10/04/18  Yes [provider]  glimepiride (AMARYL) 4 MG tablet Take 4 mg by mouth 2 (two) times daily. 10/04/18  Yes [provider]  lisinopril (ZESTRIL) 20 MG tablet Take 20 mg by mouth daily. 10/04/18  Yes [provider]  Multiple Vitamins-Minerals (CENTRUM SILVER ADULT 50+) TABS Take 1 tablet by mouth daily.   Yes [provider]  pioglitazone (ACTOS) 30  MG tablet Take 30 mg by mouth daily. 10/04/18  Yes [provider]  gentamicin (GARAMYCIN) 0.3 % ophthalmic solution Place 1 drop into the left eye every 4 (four) hours. Patient not taking: Reported on 10/08/2018 07/27/13   Noe Gens, PA-C  ibuprofen (ADVIL,MOTRIN) 800 MG tablet Take 1 tablet (800 mg total) by mouth 3 (three) times daily. Patient not taking: Reported on 10/08/2018 03/29/13   Montine Circle, PA-C    Physical Exam: Vitals:   10/08/18 1330 10/08/18 1345 10/08/18 1445 10/08/18 1500  BP: (!) 154/80 (!) 158/90 (!) 145/75 (!) 163/88  Pulse: (!) 110 (!) 110 (!) 112 (!) 116  Resp:  16 18 16   Temp:      TempSrc:      SpO2: 97% 96% 96% 97%  Weight:      Height:         . General:  Appears calm but uncomfortable and is NAD . Eyes:  PERRL, EOMI, normal lids, iris . ENT:  grossly normal hearing, lips & tongue, dry mm; poor dentition . Neck:  no LAD, masses or thyromegaly . Cardiovascular:  RR with mild tachycardia, no m/r/g. No LE edema.  Marland Kitchen Respiratory:   CTA bilaterally with no wheezes/rales/rhonchi.  Normal respiratory effort. . Abdomen:  soft, diffuse but mild suprapubic TTP, ND, NABS . Skin:  no rash or induration seen on limited exam . Musculoskeletal:  grossly normal tone BUE/BLE, good ROM, no bony abnormality . Psychiatric:  grossly normal mood and affect, speech fluent and appropriate, AOx3 . Neurologic:  CN 2-12 grossly intact, moves all extremities in coordinated fashion, sensation intact    Radiological Exams on Admission: Ct Renal Stone Study  Result Date: 10/08/2018 CLINICAL DATA:  Mid back pain for the past 3 weeks. EXAM: CT ABDOMEN AND PELVIS WITHOUT CONTRAST TECHNIQUE: Multidetector CT imaging of the abdomen and pelvis was performed following the standard protocol without IV contrast. COMPARISON:  None. FINDINGS: Lower chest: Minimal pericardial effusion with a maximum thickness of 7 mm. Normal sized heart. Clear lung bases. Hepatobiliary: No focal  liver abnormality is seen. No gallstones, gallbladder wall thickening, or biliary dilatation. Pancreas: Unremarkable. No pancreatic ductal dilatation or surrounding inflammatory changes. Spleen: Normal in size without focal abnormality. Adrenals/Urinary Tract: Normal appearing adrenal glands. Mild bilateral perinephric soft tissue stranding extending into the upper pelvis. Unremarkable ureters and urinary bladder. No urinary tract calculi or hydronephrosis seen. Stomach/Bowel: Multiple colonic diverticula. Moderate diffuse low-density rectal wall thickening with extensive perirectal soft tissue stranding and mild presacral edema. The wall thickening extends into the distal sigmoid colon, less prominent in the distal sigmoid  colon compared to the rectum. Surgically absent appendix. Unremarkable stomach and small bowel. Vascular/Lymphatic: Atheromatous arterial calcifications without aneurysm. No enlarged lymph nodes. Reproductive: Moderate-to-marked diffuse enlargement of the prostate gland and seminal vesicles with mild heterogeneity and extensive surrounding soft tissue stranding. Other: Small umbilical hernia containing fat, extending into the supraumbilical region. Musculoskeletal: Lumbar and lower thoracic spine degenerative changes. IMPRESSION: 1. Moderate-to-marked diffuse enlargement of the prostate gland and seminal vesicles with mild heterogeneity and extensive surrounding soft tissue stranding, compatible with marked acute prostatitis. 2. Changes of acute proctitis in distal sigmoid colitis. This most likely secondary to the adjacent marked changes of acute prostatitis. Proctitis and distal colitis secondarily affecting the prostate gland and seminal vesicles is also a possibility. 3. Extensive colonic diverticulosis. 4. Minimal pericardial effusion. 5. Small umbilical hernia containing fat. Electronically Signed   By: Beckie SaltsSteven  Reid M.D.   On: 10/08/2018 13:23    EKG: not done   Labs on Admission: I  have personally reviewed the available labs and imaging studies at the time of the admission.  Pertinent labs:   Na++ 128 CO2 19 Glucose 437 Albumin 2.5 WBC 25.4 UA: >500 glucose, small hgb, 80 ketones, trace LE, 30 protein, 21-50 WBC Lactate 1.7   Assessment/Plan Principal Problem:   Acute prostatitis with hematuria Active Problems:   Dehydration with hyponatremia   Hypertension   Hypercholesteremia   Diabetes mellitus without complication (HCC)   Acute prostatitis -Patient with 10 days of symptoms, not improved with 4 days of Cipro -Primarily pelvic pain and urinary symptoms with fever/diaphoresis -He has also had urinary and fecal incontinence -Currently with urinary retention, now with foley in place -Imaging c/w severe acute prostatitis, which is c/w his symptoms -Apparently also with acute reactive proctitis, possibly also colitis -Patient with be admitted for rehydration and IV antibiotics given his failure of outpatient treatment -Urology was consulted by telephone and recommends 1 month treatment with Bactrim; urine culture; and outpatient urology follow-up -OxyIR, Toradol for pain relief -He was given Rocephin in the ER x 1; will continue q24h  Dehydration with hyponatremia, marked ketonuria -Patient appears clinically dehydrated and has 80 ketones in his urine along with hyponatremia -Will provide NS rehydration at 125 cc/hr for now  DM -Poor control while in the ER, likely associated with infection and decreased use of insulin during illness -Will check A1c -hold amaryl and actos -Cover with moderate-scale SSI including qhs coverage  HTN -Resume lisinopril given normal renal function  HLD -He does not appear to be taking medications for this issue    Note: This patient has been tested and is negative for the novel coronavirus COVID-19.  DVT prophylaxis:  Lovenox  Code Status:  Full - confirmed with patient Family Communication: None present; I spoke  with his friend Verlon AuLeslie by telephone  Disposition Plan:  Home once clinically improved Consults called: Urology - telephone only  Admission status: Admit - It is my clinical opinion that admission to INPATIENT is reasonable and necessary because of the expectation that this patient will require hospital care that crosses at least 2 midnights to treat this condition based on the medical complexity of the problems presented.  Given the aforementioned information, the predictability of an adverse outcome is felt to be significant.    Jonah BlueJennifer Otha Monical MD [facility]   How to contact the [facility] Attending or Consulting provider 7A - 7P or covering provider during after hours 7P -7A, for this patient?  1. Check the care team in [facility] and  look for a) attending/consulting [facility] provider listed and b) the [facility] team listed 2. Log into [email or web address] and use 's universal password to access. If you do not have the password, please contact the hospital operator. 3. Locate the [facility] provider you are looking for under [facility] and page to a number that you can be directly reached. 4. If you still have difficulty reaching the provider, please page the [facility] (Director on Call) for the Hospitalists listed on amion for assistance.   10/08/2018, 3:59 PM

## 2018-10-08 NOTE — ED Triage Notes (Addendum)
Pt states he fell on his buttocks 2 weeks ago. Since then he has been experiencing urinary and bowel incontinence. Pt is ambulatory. Pt also endorses fevers since Monday, has been taking tylenol for these.

## 2018-10-09 ENCOUNTER — Inpatient Hospital Stay (HOSPITAL_COMMUNITY): Payer: Self-pay

## 2018-10-09 ENCOUNTER — Encounter (HOSPITAL_COMMUNITY): Payer: Self-pay | Admitting: Infectious Disease

## 2018-10-09 DIAGNOSIS — M25561 Pain in right knee: Secondary | ICD-10-CM

## 2018-10-09 DIAGNOSIS — R7881 Bacteremia: Secondary | ICD-10-CM

## 2018-10-09 DIAGNOSIS — M549 Dorsalgia, unspecified: Secondary | ICD-10-CM

## 2018-10-09 DIAGNOSIS — E119 Type 2 diabetes mellitus without complications: Secondary | ICD-10-CM

## 2018-10-09 DIAGNOSIS — B9562 Methicillin resistant Staphylococcus aureus infection as the cause of diseases classified elsewhere: Secondary | ICD-10-CM

## 2018-10-09 DIAGNOSIS — M25569 Pain in unspecified knee: Secondary | ICD-10-CM

## 2018-10-09 DIAGNOSIS — Z87891 Personal history of nicotine dependence: Secondary | ICD-10-CM

## 2018-10-09 DIAGNOSIS — R410 Disorientation, unspecified: Secondary | ICD-10-CM

## 2018-10-09 DIAGNOSIS — R159 Full incontinence of feces: Secondary | ICD-10-CM

## 2018-10-09 DIAGNOSIS — I313 Pericardial effusion (noninflammatory): Secondary | ICD-10-CM

## 2018-10-09 DIAGNOSIS — N419 Inflammatory disease of prostate, unspecified: Secondary | ICD-10-CM

## 2018-10-09 DIAGNOSIS — R109 Unspecified abdominal pain: Secondary | ICD-10-CM

## 2018-10-09 DIAGNOSIS — I10 Essential (primary) hypertension: Secondary | ICD-10-CM

## 2018-10-09 DIAGNOSIS — R32 Unspecified urinary incontinence: Secondary | ICD-10-CM

## 2018-10-09 HISTORY — DX: Bacteremia: R78.81

## 2018-10-09 HISTORY — DX: Pain in unspecified knee: M25.569

## 2018-10-09 HISTORY — DX: Methicillin resistant Staphylococcus aureus infection as the cause of diseases classified elsewhere: B95.62

## 2018-10-09 LAB — BLOOD CULTURE ID PANEL (REFLEXED)

## 2018-10-09 LAB — CBC
HCT: 38.1 % — ABNORMAL LOW (ref 39.0–52.0)
Hemoglobin: 12.7 g/dL — ABNORMAL LOW (ref 13.0–17.0)
MCH: 29.5 pg (ref 26.0–34.0)
MCHC: 33.3 g/dL (ref 30.0–36.0)
MCV: 88.6 fL (ref 80.0–100.0)
Platelets: 296 10*3/uL (ref 150–400)
RBC: 4.3 MIL/uL (ref 4.22–5.81)
RDW: 12.7 % (ref 11.5–15.5)
WBC: 26.2 10*3/uL — ABNORMAL HIGH (ref 4.0–10.5)
nRBC: 0 % (ref 0.0–0.2)

## 2018-10-09 LAB — URINE CULTURE: Culture: <10000 — AB

## 2018-10-09 LAB — BASIC METABOLIC PANEL
Anion gap: 11 (ref 5–15)
BUN: 11 mg/dL (ref 6–20)
CO2: 21 mmol/L — ABNORMAL LOW (ref 22–32)
Calcium: 8.2 mg/dL — ABNORMAL LOW (ref 8.9–10.3)
Chloride: 97 mmol/L — ABNORMAL LOW (ref 98–111)
Creatinine, Ser: 0.73 mg/dL (ref 0.61–1.24)
GFR calc Af Amer: >60 mL/min (ref >60)
GFR calc non Af Amer: >60 mL/min (ref >60)
Glucose, Bld: 280 mg/dL — ABNORMAL HIGH (ref 70–99)
Potassium: 3.9 mmol/L (ref 3.5–5.1)
Sodium: 129 mmol/L — ABNORMAL LOW (ref 135–145)

## 2018-10-09 LAB — ECHOCARDIOGRAM COMPLETE
Height: 74 in
Weight: 3712 oz

## 2018-10-09 LAB — GLUCOSE, CAPILLARY
Glucose-Capillary: 326 mg/dL — ABNORMAL HIGH (ref 70–99)
Glucose-Capillary: 311 mg/dL — ABNORMAL HIGH (ref 70–99)
Glucose-Capillary: 241 mg/dL — ABNORMAL HIGH (ref 70–99)
Glucose-Capillary: 395 mg/dL — ABNORMAL HIGH (ref 70–99)

## 2018-10-09 LAB — HIV ANTIBODY (ROUTINE TESTING W REFLEX): HIV Screen 4th Generation wRfx: NONREACTIVE

## 2018-10-09 MED ORDER — INSULIN ASPART 100 UNIT/ML ~~LOC~~ SOLN
3.0000 [IU] | Freq: Three times a day (TID) | SUBCUTANEOUS | Status: DC
  Administered 2018-10-09 – 2018-10-10 (×4): 3 [IU] via SUBCUTANEOUS

## 2018-10-09 MED ORDER — INSULIN GLARGINE 100 UNIT/ML ~~LOC~~ SOLN
25.0000 [IU] | Freq: Every day | SUBCUTANEOUS | Status: DC
  Administered 2018-10-09: 25 [IU] via SUBCUTANEOUS
  Filled 2018-10-09 (×2): qty 0.25

## 2018-10-09 MED ORDER — VANCOMYCIN HCL 10 G IV SOLR
2000.0000 mg | Freq: Once | INTRAVENOUS | Status: AC
  Administered 2018-10-09: 2000 mg via INTRAVENOUS
  Filled 2018-10-09: qty 2000

## 2018-10-09 MED ORDER — VANCOMYCIN HCL 10 G IV SOLR
1500.0000 mg | Freq: Two times a day (BID) | INTRAVENOUS | Status: DC
  Administered 2018-10-09 – 2018-10-11 (×4): 1500 mg via INTRAVENOUS
  Filled 2018-10-09 (×5): qty 1500

## 2018-10-09 NOTE — Progress Notes (Signed)
*  PRELIMINARY RESULTS* Echocardiogram 2D Echocardiogram has been performed.  Carl Baker 10/09/2018, 2:35 PM
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
## 2018-10-09 NOTE — Progress Notes (Signed)
PROGRESS NOTE    Carl Baker  ZOX:[id number] DOB: 04/01/1960 DOA: 10/08/2018 PCP: Patient, No Pcp Per     Brief Narrative:  Carl Baker is a 58 y.o. male with medical history significant of HTN, HLD, and DM presenting with bowel/bladder incontinence.  Symptoms started Monday 2 1/2 weeks ago.  He completed 4 days of antibiotics without improvement.  His urine is orange, concerning for blood in the urine.  Sexually active until about a month ago, 1 male partner.  +fever, taking Tylenol XS for pain.  It burns "like fire" when he voids.  In the emergency department, CT revealed prostatitis.  New events last 24 hours / Subjective: Blood cultures returned with MRSA.  Patient with Foley catheter in place  Assessment & Plan:   Principal Problem:   MRSA bacteremia Active Problems:   Acute prostatitis with hematuria   Dehydration with hyponatremia   Hypertension   Hypercholesteremia   Diabetes mellitus without complication (HCC)   Knee pain   Sepsis secondary to MRSA bacteremia -Appreciate infectious disease -Echocardiogram -Vancomycin  Acute prostatitis -Urine culture pending -Rocephin -Will need outpatient follow-up with urology  Right medial knee pain -MRI right knee  Uncontrolled type 2 diabetes, with hyperglycemia -Hemoglobin A1c 12.8 -Patient states that he has not been compliant with his medications, ran out of them -Diabetic coordinator consult -Lantus, sliding scale insulin  Hyponatremia -Continue IV fluids, trend BMP  Hypertension -Continue lisinopril   DVT prophylaxis: Lovenox Code Status: Full Family Communication: None Disposition Plan: Pending clinical improvement, further work-up with echocardiogram, MRI of right knee   Consultants:   Infectious disease  Procedures:   None  Antimicrobials:  Anti-infectives (From admission, onward)   Start     Dose/Rate Route Frequency Ordered Stop   10/09/18 2200  vancomycin (VANCOCIN) 1,500 mg in  sodium chloride 0.9 % 500 mL IVPB     1,500 mg 250 mL/hr over 120 Minutes Intravenous Every 12 hours 10/09/18 0618     10/09/18 1500  cefTRIAXone (ROCEPHIN) 2 g in sodium chloride 0.9 % 100 mL IVPB     2 g 200 mL/hr over 30 Minutes Intravenous Every 24 hours 10/08/18 1512     10/09/18 0630  vancomycin (VANCOCIN) 2,000 mg in sodium chloride 0.9 % 500 mL IVPB     2,000 mg 250 mL/hr over 120 Minutes Intravenous  Once 10/09/18 0618 10/09/18 1048   10/08/18 1415  cefTRIAXone (ROCEPHIN) 2 g in sodium chloride 0.9 % 100 mL IVPB     2 g 200 mL/hr over 30 Minutes Intravenous  Once 10/08/18 1413 10/08/18 1517        Objective: Vitals:   10/08/18 1730 10/08/18 1802 10/08/18 2145 10/09/18 0547  BP: (!) 150/91 (!) 144/77 117/75 121/80  Pulse: (!) 115 (!) 114 (!) 109 (!) 107  Resp: (!) 25 17 18 16   Temp:  99.3 F (37.4 C) 98.7 F (37.1 C) 99.5 F (37.5 C)  TempSrc:  Oral Oral Oral  SpO2: 95% 97% 95% 97%  Weight:      Height:        Intake/Output Summary (Last 24 hours) at 10/09/2018 1108 Last data filed at 10/09/2018 0832 Gross per 24 hour  Intake 3553.96 ml  Output 1977 ml  Net 1576.96 ml   Filed Weights   10/08/18 1135  Weight: 105.2 kg    Examination:  General exam: Appears calm and comfortable  Respiratory system: Clear to auscultation. Respiratory effort normal. Cardiovascular system: S1 & S2 heard, RRR. No JVD,  murmurs, rubs, gallops or clicks. No pedal edema. Gastrointestinal system: Abdomen is nondistended, soft and nontender. No organomegaly or masses felt. Normal bowel sounds heard. Central nervous system: Alert and oriented. No focal neurological deficits. Extremities: Symmetric 5 x 5 power. Skin: No rashes, lesions or ulcers Psychiatry: Judgement and insight appear normal. Mood & affect appropriate.   Data Reviewed: I have personally reviewed following labs and imaging studies  CBC: Recent Labs  Lab 10/08/18 1057 10/09/18 0225  WBC 25.4* 26.2*  NEUTROABS  22.7*  --   HGB 14.4 12.7*  HCT 43.1 38.1*  MCV 89.2 88.6  PLT 266 631   Basic Metabolic Panel: Recent Labs  Lab 10/08/18 1057 10/09/18 0225  NA 128* 129*  K 4.3 3.9  CL 96* 97*  CO2 19* 21*  GLUCOSE 437* 280*  BUN 12 11  CREATININE 0.89 0.73  CALCIUM 8.8* 8.2*   GFR: Estimated Creatinine Clearance: 131.7 mL/min (by C-G formula based on SCr of 0.73 mg/dL). Liver Function Tests: Recent Labs  Lab 10/08/18 1057  AST 15  ALT 22  ALKPHOS 100  BILITOT 0.9  PROT 6.8  ALBUMIN 2.5*   No results for input(s): LIPASE, AMYLASE in the last 168 hours. No results for input(s): AMMONIA in the last 168 hours. Coagulation Profile: No results for input(s): INR, PROTIME in the last 168 hours. Cardiac Enzymes: No results for input(s): CKTOTAL, CKMB, CKMBINDEX, TROPONINI in the last 168 hours. BNP (last 3 results) No results for input(s): PROBNP in the last 8760 hours. HbA1C: Recent Labs    10/08/18 1811  HGBA1C 12.8*   CBG: Recent Labs  Lab 10/08/18 1804 10/08/18 2146 10/09/18 0758  GLUCAP 305* 291* 326*   Lipid Profile: No results for input(s): CHOL, HDL, LDLCALC, TRIG, CHOLHDL, LDLDIRECT in the last 72 hours. Thyroid Function Tests: No results for input(s): TSH, T4TOTAL, FREET4, T3FREE, THYROIDAB in the last 72 hours. Anemia Panel: No results for input(s): VITAMINB12, FOLATE, FERRITIN, TIBC, IRON, RETICCTPCT in the last 72 hours. Sepsis Labs: Recent Labs  Lab 10/08/18 1420  LATICACIDVEN 1.7    Recent Results (from the past 240 hour(s))  SARS Coronavirus 2 (CEPHEID - Performed in [facility] hospital lab), Hosp Order     Status: None   Collection Time: 10/08/18  2:20 PM   Specimen: Urine, Catheterized; Nasopharyngeal  Result Value Ref Range Status   SARS Coronavirus 2 NEGATIVE NEGATIVE Final    Comment: (NOTE) If result is NEGATIVE SARS-CoV-2 target nucleic acids are NOT DETECTED. The SARS-CoV-2 RNA is generally detectable in upper and lower  respiratory  specimens during the acute phase of infection. The lowest  concentration of SARS-CoV-2 viral copies this assay can detect is 250  copies / mL. A negative result does not preclude SARS-CoV-2 infection  and should not be used as the sole basis for treatment or other  patient management decisions.  A negative result may occur with  improper specimen collection / handling, submission of specimen other  than nasopharyngeal swab, presence of viral mutation(s) within the  areas targeted by this assay, and inadequate number of viral copies  (<250 copies / mL). A negative result must be combined with clinical  observations, patient history, and epidemiological information. If result is POSITIVE SARS-CoV-2 target nucleic acids are DETECTED. The SARS-CoV-2 RNA is generally detectable in upper and lower  respiratory specimens dur ing the acute phase of infection.  Positive  results are indicative of active infection with SARS-CoV-2.  Clinical  correlation with patient history and  other diagnostic information is  necessary to determine patient infection status.  Positive results do  not rule out bacterial infection or co-infection with other viruses. If result is PRESUMPTIVE POSTIVE SARS-CoV-2 nucleic acids MAY BE PRESENT.   A presumptive positive result was obtained on the submitted specimen  and confirmed on repeat testing.  While 2019 novel coronavirus  (SARS-CoV-2) nucleic acids may be present in the submitted sample  additional confirmatory testing may be necessary for epidemiological  and / or clinical management purposes  to differentiate between  SARS-CoV-2 and other Sarbecovirus currently known to infect humans.  If clinically indicated additional testing with an alternate test  methodology [id number]) is advised. The SARS-CoV-2 RNA is generally  detectable in upper and lower respiratory sp ecimens during the acute  phase of infection. The expected result is Negative. Fact Sheet for  Patients:  BoilerBrush.com.cy Fact Sheet for Healthcare Providers: https://pope.com/ This test is not yet approved or cleared by the [place] FDA and has been authorized for detection and/or diagnosis of SARS-CoV-2 by FDA under an Emergency Use Authorization (EUA).  This EUA will remain in effect (meaning this test can be used) for the duration of the COVID-19 declaration under Section 564(b)(1) of the Act, 21 U.S.C. section 360bbb-3(b)(1), unless the authorization is terminated or revoked sooner. Performed at [facility], [address], [place], [place] [address]   Culture, blood (routine x 2)     Status: None (Preliminary result)   Collection Time: 10/08/18  2:54 PM   Specimen: BLOOD RIGHT HAND  Result Value Ref Range Status   Specimen Description BLOOD RIGHT HAND  Final   Special Requests   Final    BOTTLES DRAWN AEROBIC ONLY Blood Culture results may not be optimal due to an inadequate volume of blood received in culture bottles   Culture  Setup Time   Final    AEROBIC BOTTLE ONLY GRAM POSITIVE COCCI CRITICAL VALUE NOTED.  VALUE IS CONSISTENT WITH PREVIOUSLY REPORTED AND CALLED VALUE. Performed at [facility], [address], [place], [place] [address]    Culture GRAM POSITIVE COCCI  Final   Report Status PENDING  Incomplete  Culture, blood (routine x 2)     Status: None (Preliminary result)   Collection Time: 10/08/18  2:55 PM   Specimen: BLOOD  Result Value Ref Range Status   Specimen Description BLOOD ARM  Final   Special Requests   Final    BOTTLES DRAWN AEROBIC AND ANAEROBIC Blood Culture adequate volume   Culture  Setup Time   Final    IN BOTH AEROBIC AND ANAEROBIC BOTTLES GRAM POSITIVE COCCI Organism ID to follow CRITICAL RESULT CALLED TO, READ BACK BY AND VERIFIED WITHShela Commons [facility] 10/09/18 4782 JDW Performed at [facility], [address] [place], [place] [address]    Culture GRAM  POSITIVE COCCI  Final   Report Status PENDING  Incomplete  Blood Culture ID Panel (Reflexed)     Status: Abnormal   Collection Time: 10/08/18  2:55 PM  Result Value Ref Range Status   Enterococcus species NOT DETECTED NOT DETECTED Final   Listeria monocytogenes NOT DETECTED NOT DETECTED Final   Staphylococcus species DETECTED (A) NOT DETECTED Final    Comment: CRITICAL RESULT CALLED TO, READ BACK BY AND VERIFIED WITH: J LEDFORD [facility] 10/09/18 3086 JDW    Staphylococcus aureus (BCID) DETECTED (A) NOT DETECTED Final    Comment: Methicillin (oxacillin)-resistant Staphylococcus aureus (MRSA). MRSA is predictably resistant  to beta-lactam antibiotics (except ceftaroline). Preferred therapy is vancomycin unless clinically contraindicated. Patient requires contact precautions if  hospitalized. CRITICAL RESULT CALLED TO, READ BACK BY AND VERIFIED WITH: J [facility]ORD [facility] 10/09/18 [id number] JDW    Methicillin resistance DETECTED (A) NOT DETECTED Final    Comment: CRITICAL RESULT CALLED TO, READ BACK BY AND VERIFIED WITH: J LEDFORD [facility] 10/09/18 [id number] JDW    Streptococcus species NOT DETECTED NOT DETECTED Final   Streptococcus agalactiae NOT DETECTED NOT DETECTED Final   Streptococcus pneumoniae NOT DETECTED NOT DETECTED Final   Streptococcus pyogenes NOT DETECTED NOT DETECTED Final   Acinetobacter baumannii NOT DETECTED NOT DETECTED Final   Enterobacteriaceae species NOT DETECTED NOT DETECTED Final   Enterobacter cloacae complex NOT DETECTED NOT DETECTED Final   Escherichia coli NOT DETECTED NOT DETECTED Final   Klebsiella oxytoca NOT DETECTED NOT DETECTED Final   Klebsiella pneumoniae NOT DETECTED NOT DETECTED Final   Proteus species NOT DETECTED NOT DETECTED Final   Serratia marcescens NOT DETECTED NOT DETECTED Final   Haemophilus influenzae NOT DETECTED NOT DETECTED Final   Neisseria meningitidis NOT DETECTED NOT DETECTED Final   Pseudomonas aeruginosa NOT DETECTED NOT DETECTED Final   Candida  albicans NOT DETECTED NOT DETECTED Final   Candida glabrata NOT DETECTED NOT DETECTED Final   Candida krusei NOT DETECTED NOT DETECTED Final   Candida parapsilosis NOT DETECTED NOT DETECTED Final   Candida tropicalis NOT DETECTED NOT DETECTED Final    Comment: Performed at [facility], [address] [place], [place] [address]      Radiology Studies: Ct Renal Stone Study  Result Date: 10/08/2018 CLINICAL DATA:  Mid back pain for the past 3 weeks. EXAM: CT ABDOMEN AND PELVIS WITHOUT CONTRAST TECHNIQUE: Multidetector CT imaging of the abdomen and pelvis was performed following the standard protocol without IV contrast. COMPARISON:  None. FINDINGS: Lower chest: Minimal pericardial effusion with a maximum thickness of 7 mm. Normal sized heart. Clear lung bases. Hepatobiliary: No focal liver abnormality is seen. No gallstones, gallbladder wall thickening, or biliary dilatation. Pancreas: Unremarkable. No pancreatic ductal dilatation or surrounding inflammatory changes. Spleen: Normal in size without focal abnormality. Adrenals/Urinary Tract: Normal appearing adrenal glands. Mild bilateral perinephric soft tissue stranding extending into the upper pelvis. Unremarkable ureters and urinary bladder. No urinary tract calculi or hydronephrosis seen. Stomach/Bowel: Multiple colonic diverticula. Moderate diffuse low-density rectal wall thickening with extensive perirectal soft tissue stranding and mild presacral edema. The wall thickening extends into the distal sigmoid colon, less prominent in the distal sigmoid colon compared to the rectum. Surgically absent appendix. Unremarkable stomach and small bowel. Vascular/Lymphatic: Atheromatous arterial calcifications without aneurysm. No enlarged lymph nodes. Reproductive: Moderate-to-marked diffuse enlargement of the prostate gland and seminal vesicles with mild heterogeneity and extensive surrounding soft tissue stranding. Other: Small umbilical hernia  containing fat, extending into the supraumbilical region. Musculoskeletal: Lumbar and lower thoracic spine degenerative changes. IMPRESSION: 1. Moderate-to-marked diffuse enlargement of the prostate gland and seminal vesicles with mild heterogeneity and extensive surrounding soft tissue stranding, compatible with marked acute prostatitis. 2. Changes of acute proctitis in distal sigmoid colitis. This most likely secondary to the adjacent marked changes of acute prostatitis. Proctitis and distal colitis secondarily affecting the prostate gland and seminal vesicles is also a possibility. 3. Extensive colonic diverticulosis. 4. Minimal pericardial effusion. 5. Small umbilical hernia containing fat. Electronically Signed   By: Beckie SaltsSteven  Reid M.D.   On: 10/08/2018 13:23      Scheduled Meds:  enoxaparin (LOVENOX) injection  40 mg Subcutaneous Q24H   insulin aspart  0-15 Units Subcutaneous TID WC   insulin aspart  0-5 Units Subcutaneous QHS   insulin aspart  3 Units Subcutaneous TID WC   insulin glargine  25 Units Subcutaneous Daily   lisinopril  20 mg Oral Daily   Continuous Infusions:  sodium chloride 125 mL/hr at 10/09/18 0500   cefTRIAXone (ROCEPHIN)  IV     vancomycin       LOS: 1 day      Time spent: 35 minutes   Noralee StainJennifer Octavia Mottola, DO [facility] [email or web address] 10/09/2018, 11:08 AM

## 2018-10-09 NOTE — Progress Notes (Signed)
PHARMACY - PHYSICIAN COMMUNICATION CRITICAL VALUE ALERT - BLOOD CULTURE IDENTIFICATION (BCID)  Carl Baker is an 58 y.o. male who presented to [facility] on 10/08/2018 with a chief complaint of urinary incontinence  Assessment:  Acute prostatitis   Name of physician (or Provider) Contacted: X Blount ([facility])   Current antibiotics: Ceftriaxone   Changes to prescribed antibiotics recommended:  Add Vancomycin   Results for orders placed or performed during the hospital encounter of 10/08/18  Blood Culture ID Panel (Reflexed) (Collected: 10/08/2018  2:55 PM)  Result Value Ref Range   Enterococcus species NOT DETECTED NOT DETECTED   Listeria monocytogenes NOT DETECTED NOT DETECTED   Staphylococcus species DETECTED (A) NOT DETECTED   Staphylococcus aureus (BCID) DETECTED (A) NOT DETECTED   Methicillin resistance DETECTED (A) NOT DETECTED   Streptococcus species NOT DETECTED NOT DETECTED   Streptococcus agalactiae NOT DETECTED NOT DETECTED   Streptococcus pneumoniae NOT DETECTED NOT DETECTED   Streptococcus pyogenes NOT DETECTED NOT DETECTED   Acinetobacter baumannii NOT DETECTED NOT DETECTED   Enterobacteriaceae species NOT DETECTED NOT DETECTED   Enterobacter cloacae complex NOT DETECTED NOT DETECTED   Escherichia coli NOT DETECTED NOT DETECTED   Klebsiella oxytoca NOT DETECTED NOT DETECTED   Klebsiella pneumoniae NOT DETECTED NOT DETECTED   Proteus species NOT DETECTED NOT DETECTED   Serratia marcescens NOT DETECTED NOT DETECTED   Haemophilus influenzae NOT DETECTED NOT DETECTED   Neisseria meningitidis NOT DETECTED NOT DETECTED   Pseudomonas aeruginosa NOT DETECTED NOT DETECTED   Candida albicans NOT DETECTED NOT DETECTED   Candida glabrata NOT DETECTED NOT DETECTED   Candida krusei NOT DETECTED NOT DETECTED   Candida parapsilosis NOT DETECTED NOT DETECTED   Candida tropicalis NOT DETECTED NOT DETECTED    Narda Bonds 10/09/2018  6:19 AM

## 2018-10-10 ENCOUNTER — Inpatient Hospital Stay (HOSPITAL_COMMUNITY): Payer: Self-pay

## 2018-10-10 LAB — BASIC METABOLIC PANEL
Anion gap: 10 (ref 5–15)
BUN: 10 mg/dL (ref 6–20)
CO2: 22 mmol/L (ref 22–32)
Calcium: 7.8 mg/dL — ABNORMAL LOW (ref 8.9–10.3)
Chloride: 98 mmol/L (ref 98–111)
Creatinine, Ser: 0.69 mg/dL (ref 0.61–1.24)
GFR calc Af Amer: >60 mL/min (ref >60)
GFR calc non Af Amer: >60 mL/min (ref >60)
Glucose, Bld: 256 mg/dL — ABNORMAL HIGH (ref 70–99)
Potassium: 3.5 mmol/L (ref 3.5–5.1)
Sodium: 130 mmol/L — ABNORMAL LOW (ref 135–145)

## 2018-10-10 LAB — CBC
HCT: 35.8 % — ABNORMAL LOW (ref 39.0–52.0)
Hemoglobin: 12.1 g/dL — ABNORMAL LOW (ref 13.0–17.0)
MCH: 29.8 pg (ref 26.0–34.0)
MCHC: 33.8 g/dL (ref 30.0–36.0)
MCV: 88.2 fL (ref 80.0–100.0)
Platelets: 287 10*3/uL (ref 150–400)
RBC: 4.06 MIL/uL — ABNORMAL LOW (ref 4.22–5.81)
RDW: 12.6 % (ref 11.5–15.5)
WBC: 21.1 10*3/uL — ABNORMAL HIGH (ref 4.0–10.5)
nRBC: 0 % (ref 0.0–0.2)

## 2018-10-10 LAB — SEDIMENTATION RATE: Sed Rate: 96 mm/hr — ABNORMAL HIGH (ref 0–16)

## 2018-10-10 LAB — GLUCOSE, CAPILLARY
Glucose-Capillary: 268 mg/dL — ABNORMAL HIGH (ref 70–99)
Glucose-Capillary: 243 mg/dL — ABNORMAL HIGH (ref 70–99)
Glucose-Capillary: 232 mg/dL — ABNORMAL HIGH (ref 70–99)
Glucose-Capillary: 197 mg/dL — ABNORMAL HIGH (ref 70–99)

## 2018-10-10 LAB — C-REACTIVE PROTEIN: CRP: 27.2 mg/dL — ABNORMAL HIGH (ref <1.0)

## 2018-10-10 MED ORDER — INSULIN GLARGINE 100 UNIT/ML ~~LOC~~ SOLN
30.0000 [IU] | Freq: Every day | SUBCUTANEOUS | Status: DC
  Administered 2018-10-10 – 2018-10-13 (×4): 30 [IU] via SUBCUTANEOUS
  Filled 2018-10-10 (×4): qty 0.3

## 2018-10-10 MED ORDER — INSULIN STARTER KIT- PEN NEEDLES (ENGLISH)
1.0000 | Freq: Once | Status: DC
  Filled 2018-10-10: qty 1

## 2018-10-10 MED ORDER — INSULIN ASPART 100 UNIT/ML ~~LOC~~ SOLN
8.0000 [IU] | Freq: Three times a day (TID) | SUBCUTANEOUS | Status: DC
  Administered 2018-10-11 – 2018-10-13 (×6): 8 [IU] via SUBCUTANEOUS

## 2018-10-10 MED ORDER — INSULIN ASPART 100 UNIT/ML ~~LOC~~ SOLN
5.0000 [IU] | Freq: Three times a day (TID) | SUBCUTANEOUS | Status: DC
  Administered 2018-10-10 (×2): 5 [IU] via SUBCUTANEOUS

## 2018-10-10 MED ORDER — LIVING WELL WITH DIABETES BOOK
Freq: Once | Status: AC
  Administered 2018-10-10: 21:00:00
  Filled 2018-10-10: qty 1

## 2018-10-10 MED ORDER — GADOBUTROL 1 MMOL/ML IV SOLN
10.0000 mL | Freq: Once | INTRAVENOUS | Status: AC | PRN
  Administered 2018-10-10: 10 mL via INTRAVENOUS

## 2018-10-10 NOTE — Progress Notes (Signed)
Inpatient Diabetes Program Recommendations  AACE/ADA: New Consensus Statement on Inpatient Glycemic Control (2015)  Target Ranges:  Prepandial:   less than 140 mg/dL      Peak postprandial:   less than 180 mg/dL (1-2 hours)      Critically ill patients:  140 - 180 mg/dL   Lab Results  Component Value Date   GLUCAP 232 (H) 10/10/2018   HGBA1C 12.8 (H) 10/08/2018    Review of Glycemic Control  Diabetes history: DM2 Outpatient Diabetes medications: Amaryl 4 mg bid, Actos 30 mg QD (has not been taking) Current orders for Inpatient glycemic control: Lantus 30 units QD, Novolog 0-15 units tidwc and 0-5 units QHS + 5 units tidwc  HgbA1C - 12.8% - uncontrolled Pt states non-compliance with meds, ran out of them.  Inpatient Diabetes Program Recommendations:     Increase Novolog to 8 units tidwc for meal coverage insulin. OP Diabetes Education consult.  Will need to f/u with PCP for diabetes management and HgbA1C of 12.8%. Pt has no insurance coverage.   Lifestyle modification with diet, exercise and weight loss.  Will order diabetes book and insulin pen starter kit.  Care management consult for assistance with referral to [facility] and medications.  Diabetes Coordinator to see in am. Will ask bedside RN to allow pt to prick finger and give his own insulin.  Will follow.  Thank you. Lorenda Peck, RD, LDN, CDE Inpatient Diabetes Coordinator [phone]

## 2018-10-10 NOTE — Evaluation (Signed)
Physical Therapy Evaluation Patient Details Name: Carl Baker MRN: [id number] DOB: 10/14/1960 Today's Date: 10/10/2018   History of Present Illness  Patient is a 58 y/o male presenting to the ED on 10/08/2018 with primary complaints of Bowel/bladder incontinence. Past medical history significant of HTN; HLD; and DM. Admitted for Acute prostatitis.    Clinical Impression  Patient admitted with the above. Patient independent at baseline for mobility and ADLs. Patient today with primary reports of pain at coccyx from recent fall. Patient performing functional mobility at supervision to Mod I level - no LOB or need for physical assist with mobility. No further acute PT needs identified. PT to sign off.     Follow Up Recommendations No PT follow up    Equipment Recommendations  None recommended by PT    Recommendations for Other Services       Precautions / Restrictions Precautions Precautions: Fall Restrictions Weight Bearing Restrictions: No      Mobility  Bed Mobility Overal bed mobility: Modified Independent                Transfers Overall transfer level: Needs assistance Equipment used: None Transfers: Sit to/from Stand Sit to Stand: Supervision         General transfer comment: for safety  Ambulation/Gait Ambulation/Gait assistance: Supervision Gait Distance (Feet): 350 Feet Assistive device: None Gait Pattern/deviations: Step-through pattern Gait velocity: WNL   General Gait Details: steady pace of gait; no LOB; no physical assist needed  Stairs            Wheelchair Mobility    Modified Rankin (Stroke Patients Only)       Balance Overall balance assessment: Modified Independent                                           Pertinent Vitals/Pain Pain Assessment: Faces Faces Pain Scale: Hurts even more Pain Location: "butt bone" Pain Descriptors / Indicators: Aching;Discomfort;Guarding Pain Intervention(s): Limited  activity within patient's tolerance;Monitored during session;Repositioned;Patient requesting pain meds-RN notified    Home Living Family/patient expects to be discharged to:: Private residence Living Arrangements: Alone Available Help at Discharge: Friend(s);Available PRN/intermittently Type of Home: House       Home Layout: One level Home Equipment: None      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     Hand Dominance        Extremity/Trunk Assessment   Upper Extremity Assessment Upper Extremity Assessment: Overall WFL for tasks assessed    Lower Extremity Assessment Lower Extremity Assessment: Overall WFL for tasks assessed    Cervical / Trunk Assessment Cervical / Trunk Assessment: Normal  Communication   Communication: No difficulties  Cognition Arousal/Alertness: Awake/alert Behavior During Therapy: WFL for tasks assessed/performed Overall Cognitive Status: Within Functional Limits for tasks assessed                                        General Comments      Exercises     Assessment/Plan    PT Assessment Patent does not need any further PT services  PT Problem List         PT Treatment Interventions      PT Goals (Current goals can be found in the  Care Plan section)  Acute Rehab PT Goals Patient Stated Goal: go home tomorrow PT Goal Formulation: All assessment and education complete, DC therapy    Frequency     Barriers to discharge        Co-evaluation               AM-PAC PT "6 Clicks" Mobility  Outcome Measure Help needed turning from your back to your side while in a flat bed without using bedrails?: None Help needed moving from lying on your back to sitting on the side of a flat bed without using bedrails?: None Help needed moving to and from a bed to a chair (including a wheelchair)?: A Little Help needed standing up from a chair using your arms (e.g., wheelchair  or bedside chair)?: A Little Help needed to walk in hospital room?: A Little Help needed climbing 3-5 steps with a railing? : A Little 6 Click Score: 20    End of Session Equipment Utilized During Treatment: Gait belt Activity Tolerance: Patient tolerated treatment well Patient left: in bed;with call bell/phone within reach;with nursing/sitter in room Nurse Communication: Mobility status PT Visit Diagnosis: Unsteadiness on feet (R26.81)    Time: [id number] PT Time Calculation (min) (ACUTE ONLY): 22 min   Charges:   PT Evaluation $PT Eval Moderate Complexity: 1 Mod         Kipp LaurenceStephanie R , PT, DPT Supplemental Physical Therapist 10/10/18 2:31 PM Pager: [phone] Office: [phone]

## 2018-10-10 NOTE — Progress Notes (Signed)
PROGRESS NOTE    Carl Baker  ZOX:[id number] DOB: 04/12/1960 DOA: 10/08/2018 PCP: Patient, No Pcp Per     Brief Narrative:  Carl OmsClarence Baker is a 58 y.o. male with medical history significant of HTN, HLD, and DM presenting with bowel/bladder incontinence.  Symptoms started Monday 2 1/2 weeks ago.  He completed 4 days of antibiotics without improvement.  His urine is orange, concerning for blood in the urine.  Sexually active until about a month ago, 1 male partner.  +fever, taking Tylenol XS for pain.  It burns "like fire" when he voids.  In the emergency department, CT revealed prostatitis. Blood cultures returned with MRSA.  Patient with Foley catheter in place  New events last 24 hours / Subjective: Continues to complain of pain in his "butt bone."  Assessment & Plan:   Principal Problem:   MRSA bacteremia Active Problems:   Acute prostatitis with hematuria   Dehydration with hyponatremia   Hypertension   Hypercholesteremia   Diabetes mellitus without complication (HCC)   Knee pain   Sepsis secondary to MRSA bacteremia -Appreciate infectious disease -Echocardiogram completed, did not reveal vegetation -Vancomycin -Repeat blood culture pending  Acute prostatitis -Urine culture negative -Vancomycin as above -Will need outpatient follow-up with urology  Right medial knee pain -MRI right knee pending  Uncontrolled type 2 diabetes, with hyperglycemia -Hemoglobin A1c 12.8 -Patient states that he has not been compliant with his medications, ran out of them -Diabetic coordinator consult -Lantus, sliding scale insulin.  Insulin dosing increased today  Hyponatremia -Continue IV fluids, trend BMP.  Improving slowly  Hypertension -Continue lisinopril  Degenerative changes lumbar and thoracic spine -Pain control -?  MRI sacrum/coccyx   DVT prophylaxis: Lovenox Code Status: Full Family Communication: None Disposition Plan: Pending clinical improvement, clearance  of bacteremia, MRI of right knee   Consultants:   Infectious disease  Procedures:   None  Antimicrobials:  Anti-infectives (From admission, onward)   Start     Dose/Rate Route Frequency Ordered Stop   10/09/18 2200  vancomycin (VANCOCIN) 1,500 mg in sodium chloride 0.9 % 500 mL IVPB     1,500 mg 250 mL/hr over 120 Minutes Intravenous Every 12 hours 10/09/18 0618     10/09/18 1500  cefTRIAXone (ROCEPHIN) 2 g in sodium chloride 0.9 % 100 mL IVPB  Status:  Discontinued     2 g 200 mL/hr over 30 Minutes Intravenous Every 24 hours 10/08/18 1512 10/09/18 1326   10/09/18 0630  vancomycin (VANCOCIN) 2,000 mg in sodium chloride 0.9 % 500 mL IVPB     2,000 mg 250 mL/hr over 120 Minutes Intravenous  Once 10/09/18 0618 10/09/18 1048   10/08/18 1415  cefTRIAXone (ROCEPHIN) 2 g in sodium chloride 0.9 % 100 mL IVPB     2 g 200 mL/hr over 30 Minutes Intravenous  Once 10/08/18 1413 10/08/18 1517       Objective: Vitals:   10/09/18 1722 10/09/18 2206 10/10/18 0414 10/10/18 1020  BP:  127/69 102/75 130/74  Pulse:  (!) 108 99   Resp:  18 18   Temp: 99 F (37.2 C) 99.4 F (37.4 C) 98.4 F (36.9 C)   TempSrc: Oral     SpO2:  95% 94%   Weight:      Height:        Intake/Output Summary (Last 24 hours) at 10/10/2018 1206 Last data filed at 10/10/2018 0810 Gross per 24 hour  Intake 2996.11 ml  Output 1100 ml  Net 1896.11 ml   Filed  Weights   10/08/18 1135  Weight: 105.2 kg    Examination: General exam: Appears calm and comfortable  Respiratory system: Clear to auscultation. Respiratory effort normal. Cardiovascular system: S1 & S2 heard, RRR. No JVD, murmurs, rubs, gallops or clicks. No pedal edema. Gastrointestinal system: Abdomen is nondistended, soft and nontender. No organomegaly or masses felt. Normal bowel sounds heard. GU: Foley catheter in place, no swelling of scrotum or tenderness to palpation Central nervous system: Alert and oriented. No focal neurological deficits.  Extremities: Symmetric 5 x 5 power. Skin: No rashes, lesions or ulcers Psychiatry: Judgement and insight appear normal. Mood & affect appropriate.   Data Reviewed: I have personally reviewed following labs and imaging studies  CBC: Recent Labs  Lab 10/08/18 1057 10/09/18 0225 10/10/18 0334  WBC 25.4* 26.2* 21.1*  NEUTROABS 22.7*  --   --   HGB 14.4 12.7* 12.1*  HCT 43.1 38.1* 35.8*  MCV 89.2 88.6 88.2  PLT 266 296 378   Basic Metabolic Panel: Recent Labs  Lab 10/08/18 1057 10/09/18 0225 10/10/18 0334  NA 128* 129* 130*  K 4.3 3.9 3.5  CL 96* 97* 98  CO2 19* 21* 22  GLUCOSE 437* 280* 256*  BUN 12 11 10   CREATININE 0.89 0.73 0.69  CALCIUM 8.8* 8.2* 7.8*   GFR: Estimated Creatinine Clearance: 131.7 mL/min (by C-G formula based on SCr of 0.69 mg/dL). Liver Function Tests: Recent Labs  Lab 10/08/18 1057  AST 15  ALT 22  ALKPHOS 100  BILITOT 0.9  PROT 6.8  ALBUMIN 2.5*   No results for input(s): LIPASE, AMYLASE in the last 168 hours. No results for input(s): AMMONIA in the last 168 hours. Coagulation Profile: No results for input(s): INR, PROTIME in the last 168 hours. Cardiac Enzymes: No results for input(s): CKTOTAL, CKMB, CKMBINDEX, TROPONINI in the last 168 hours. BNP (last 3 results) No results for input(s): PROBNP in the last 8760 hours. HbA1C: Recent Labs    10/08/18 1811  HGBA1C 12.8*   CBG: Recent Labs  Lab 10/09/18 0758 10/09/18 1152 10/09/18 1704 10/09/18 2203 10/10/18 0737  GLUCAP 326* 311* 241* 395* 268*   Lipid Profile: No results for input(s): CHOL, HDL, LDLCALC, TRIG, CHOLHDL, LDLDIRECT in the last 72 hours. Thyroid Function Tests: No results for input(s): TSH, T4TOTAL, FREET4, T3FREE, THYROIDAB in the last 72 hours. Anemia Panel: No results for input(s): VITAMINB12, FOLATE, FERRITIN, TIBC, IRON, RETICCTPCT in the last 72 hours. Sepsis Labs: Recent Labs  Lab 10/08/18 1420  LATICACIDVEN 1.7    Recent Results (from the past  240 hour(s))  SARS Coronavirus 2 (CEPHEID - Performed in [facility] hospital lab), Hosp Order     Status: None   Collection Time: 10/08/18  2:20 PM   Specimen: Urine, Catheterized; Nasopharyngeal  Result Value Ref Range Status   SARS Coronavirus 2 NEGATIVE NEGATIVE Final    Comment: (NOTE) If result is NEGATIVE SARS-CoV-2 target nucleic acids are NOT DETECTED. The SARS-CoV-2 RNA is generally detectable in upper and lower  respiratory specimens during the acute phase of infection. The lowest  concentration of SARS-CoV-2 viral copies this assay can detect is 250  copies / mL. A negative result does not preclude SARS-CoV-2 infection  and should not be used as the sole basis for treatment or other  patient management decisions.  A negative result may occur with  improper specimen collection / handling, submission of specimen other  than nasopharyngeal swab, presence of viral mutation(s) within the  areas targeted by this  assay, and inadequate number of viral copies  (<250 copies / mL). A negative result must be combined with clinical  observations, patient history, and epidemiological information. If result is POSITIVE SARS-CoV-2 target nucleic acids are DETECTED. The SARS-CoV-2 RNA is generally detectable in upper and lower  respiratory specimens dur ing the acute phase of infection.  Positive  results are indicative of active infection with SARS-CoV-2.  Clinical  correlation with patient history and other diagnostic information is  necessary to determine patient infection status.  Positive results do  not rule out bacterial infection or co-infection with other viruses. If result is PRESUMPTIVE POSTIVE SARS-CoV-2 nucleic acids MAY BE PRESENT.   A presumptive positive result was obtained on the submitted specimen  and confirmed on repeat testing.  While 2019 novel coronavirus  (SARS-CoV-2) nucleic acids may be present in the submitted sample  additional confirmatory testing may be  necessary for epidemiological  and / or clinical management purposes  to differentiate between  SARS-CoV-2 and other Sarbecovirus currently known to infect humans.  If clinically indicated additional testing with an alternate test  methodology [phone] is advised. The SARS-CoV-2 RNA is generally  detectable in upper and lower respiratory sp ecimens during the acute  phase of infection. The expected result is Negative. Fact Sheet for Patients:  BoilerBrush.com.cyhttps://www.fda.gov/media/136312/download Fact Sheet for Healthcare Providers: https://pope.com/https://www.fda.gov/media/136313/download This test is not yet approved or cleared by the [place] FDA and has been authorized for detection and/or diagnosis of SARS-CoV-2 by FDA under an Emergency Use Authorization (EUA).  This EUA will remain in effect (meaning this test can be used) for the duration of the COVID-19 declaration under Section 564(b)(1) of the Act, 21 U.S.C. section 360bbb-3(b)(1), unless the authorization is terminated or revoked sooner. Performed at [facility], [address] [place], [place] [address]   Urine culture     Status: Abnormal   Collection Time: 10/08/18  2:20 PM   Specimen: Urine, Random  Result Value Ref Range Status   Specimen Description URINE, RANDOM  Final   Special Requests NONE  Final   Culture (A)  Final    <10,000 COLONIES/mL INSIGNIFICANT GROWTH Performed at [facility], [address] [place], [place] [address]    Report Status 10/09/2018 FINAL  Final  Culture, blood (routine x 2)     Status: Abnormal (Preliminary result)   Collection Time: 10/08/18  2:54 PM   Specimen: BLOOD RIGHT HAND  Result Value Ref Range Status   Specimen Description BLOOD RIGHT HAND  Final   Special Requests   Final    BOTTLES DRAWN AEROBIC ONLY Blood Culture results may not be optimal due to an inadequate volume of blood received in culture bottles   Culture  Setup Time   Final    AEROBIC BOTTLE ONLY GRAM POSITIVE  COCCI CRITICAL VALUE NOTED.  VALUE IS CONSISTENT WITH PREVIOUSLY REPORTED AND CALLED VALUE.    Culture (A)  Final    STAPHYLOCOCCUS AUREUS SUSCEPTIBILITIES TO FOLLOW Performed at [facility], [address] [place], [place] [address]    Report Status PENDING  Incomplete  Culture, blood (routine x 2)     Status: Abnormal (Preliminary result)   Collection Time: 10/08/18  2:55 PM   Specimen: BLOOD  Result Value Ref Range Status   Specimen Description BLOOD ARM  Final   Special Requests   Final    BOTTLES DRAWN AEROBIC AND ANAEROBIC Blood Culture adequate volume   Culture  Setup Time  Final    IN BOTH AEROBIC AND ANAEROBIC BOTTLES GRAM POSITIVE COCCI CRITICAL RESULT CALLED TO, READ BACK BY AND VERIFIED WITH: J LEDFORD [facility] 10/09/18 1610 JDW    Culture (A)  Final    STAPHYLOCOCCUS AUREUS SUSCEPTIBILITIES TO FOLLOW Performed at [facility], [address], [place], [place] [address]    Report Status PENDING  Incomplete  Blood Culture ID Panel (Reflexed)     Status: Abnormal   Collection Time: 10/08/18  2:55 PM  Result Value Ref Range Status   Enterococcus species NOT DETECTED NOT DETECTED Final   Listeria monocytogenes NOT DETECTED NOT DETECTED Final   Staphylococcus species DETECTED (A) NOT DETECTED Final    Comment: CRITICAL RESULT CALLED TO, READ BACK BY AND VERIFIED WITH: J LEDFORD [facility] 10/09/18 4098 JDW    Staphylococcus aureus (BCID) DETECTED (A) NOT DETECTED Final    Comment: Methicillin (oxacillin)-resistant Staphylococcus aureus (MRSA). MRSA is predictably resistant to beta-lactam antibiotics (except ceftaroline). Preferred therapy is vancomycin unless clinically contraindicated. Patient requires contact precautions if  hospitalized. CRITICAL RESULT CALLED TO, READ BACK BY AND VERIFIED WITH: J LEDFORD [facility] 10/09/18 1191 JDW    Methicillin resistance DETECTED (A) NOT DETECTED Final    Comment: CRITICAL RESULT CALLED TO, READ BACK BY AND VERIFIED WITH:  J LEDFORD [facility] 10/09/18 4782 JDW    Streptococcus species NOT DETECTED NOT DETECTED Final   Streptococcus agalactiae NOT DETECTED NOT DETECTED Final   Streptococcus pneumoniae NOT DETECTED NOT DETECTED Final   Streptococcus pyogenes NOT DETECTED NOT DETECTED Final   Acinetobacter baumannii NOT DETECTED NOT DETECTED Final   Enterobacteriaceae species NOT DETECTED NOT DETECTED Final   Enterobacter cloacae complex NOT DETECTED NOT DETECTED Final   Escherichia coli NOT DETECTED NOT DETECTED Final   Klebsiella oxytoca NOT DETECTED NOT DETECTED Final   Klebsiella pneumoniae NOT DETECTED NOT DETECTED Final   Proteus species NOT DETECTED NOT DETECTED Final   Serratia marcescens NOT DETECTED NOT DETECTED Final   Haemophilus influenzae NOT DETECTED NOT DETECTED Final   Neisseria meningitidis NOT DETECTED NOT DETECTED Final   Pseudomonas aeruginosa NOT DETECTED NOT DETECTED Final   Candida albicans NOT DETECTED NOT DETECTED Final   Candida glabrata NOT DETECTED NOT DETECTED Final   Candida krusei NOT DETECTED NOT DETECTED Final   Candida parapsilosis NOT DETECTED NOT DETECTED Final   Candida tropicalis NOT DETECTED NOT DETECTED Final    Comment: Performed at [facility], [address] [place], [place] [address]      Radiology Studies: Ct Renal Stone Study  Result Date: 10/08/2018 CLINICAL DATA:  Mid back pain for the past 3 weeks. EXAM: CT ABDOMEN AND PELVIS WITHOUT CONTRAST TECHNIQUE: Multidetector CT imaging of the abdomen and pelvis was performed following the standard protocol without IV contrast. COMPARISON:  None. FINDINGS: Lower chest: Minimal pericardial effusion with a maximum thickness of 7 mm. Normal sized heart. Clear lung bases. Hepatobiliary: No focal liver abnormality is seen. No gallstones, gallbladder wall thickening, or biliary dilatation. Pancreas: Unremarkable. No pancreatic ductal dilatation or surrounding inflammatory changes. Spleen: Normal in size without focal  abnormality. Adrenals/Urinary Tract: Normal appearing adrenal glands. Mild bilateral perinephric soft tissue stranding extending into the upper pelvis. Unremarkable ureters and urinary bladder. No urinary tract calculi or hydronephrosis seen. Stomach/Bowel: Multiple colonic diverticula. Moderate diffuse low-density rectal wall thickening with extensive perirectal soft tissue stranding and mild presacral edema. The wall thickening extends into the distal sigmoid colon, less prominent in the distal sigmoid colon compared to  the rectum. Surgically absent appendix. Unremarkable stomach and small bowel. Vascular/Lymphatic: Atheromatous arterial calcifications without aneurysm. No enlarged lymph nodes. Reproductive: Moderate-to-marked diffuse enlargement of the prostate gland and seminal vesicles with mild heterogeneity and extensive surrounding soft tissue stranding. Other: Small umbilical hernia containing fat, extending into the supraumbilical region. Musculoskeletal: Lumbar and lower thoracic spine degenerative changes. IMPRESSION: 1. Moderate-to-marked diffuse enlargement of the prostate gland and seminal vesicles with mild heterogeneity and extensive surrounding soft tissue stranding, compatible with marked acute prostatitis. 2. Changes of acute proctitis in distal sigmoid colitis. This most likely secondary to the adjacent marked changes of acute prostatitis. Proctitis and distal colitis secondarily affecting the prostate gland and seminal vesicles is also a possibility. 3. Extensive colonic diverticulosis. 4. Minimal pericardial effusion. 5. Small umbilical hernia containing fat. Electronically Signed   By: Beckie SaltsSteven  Reid M.D.   On: 10/08/2018 13:23      Scheduled Meds: . enoxaparin (LOVENOX) injection  40 mg Subcutaneous Q24H  . insulin aspart  0-15 Units Subcutaneous TID WC  . insulin aspart  0-5 Units Subcutaneous QHS  . insulin aspart  5 Units Subcutaneous TID WC  . insulin glargine  30 Units  Subcutaneous Daily  . lisinopril  20 mg Oral Daily   Continuous Infusions: . sodium chloride 125 mL/hr at 10/09/18 2101  . vancomycin 1,500 mg (10/10/18 1007)     LOS: 2 days      Time spent: 25 minutes   Noralee StainJennifer Garlon Tuggle, DO [facility] [email or web address] 10/10/2018, 12:06 PM

## 2018-10-11 DIAGNOSIS — N410 Acute prostatitis: Secondary | ICD-10-CM

## 2018-10-11 DIAGNOSIS — M25461 Effusion, right knee: Secondary | ICD-10-CM

## 2018-10-11 DIAGNOSIS — Z960 Presence of urogenital implants: Secondary | ICD-10-CM

## 2018-10-11 LAB — CULTURE, BLOOD (ROUTINE X 2): Special Requests: ADEQUATE

## 2018-10-11 LAB — CBC
HCT: 33.7 % — ABNORMAL LOW (ref 39.0–52.0)
Hemoglobin: 11.5 g/dL — ABNORMAL LOW (ref 13.0–17.0)
MCH: 29.7 pg (ref 26.0–34.0)
MCHC: 34.1 g/dL (ref 30.0–36.0)
MCV: 87.1 fL (ref 80.0–100.0)
Platelets: 298 10*3/uL (ref 150–400)
RBC: 3.87 MIL/uL — ABNORMAL LOW (ref 4.22–5.81)
RDW: 12.5 % (ref 11.5–15.5)
WBC: 16.6 10*3/uL — ABNORMAL HIGH (ref 4.0–10.5)
nRBC: 0 % (ref 0.0–0.2)

## 2018-10-11 LAB — GLUCOSE, CAPILLARY
Glucose-Capillary: 168 mg/dL — ABNORMAL HIGH (ref 70–99)
Glucose-Capillary: 157 mg/dL — ABNORMAL HIGH (ref 70–99)
Glucose-Capillary: 161 mg/dL — ABNORMAL HIGH (ref 70–99)
Glucose-Capillary: 149 mg/dL — ABNORMAL HIGH (ref 70–99)

## 2018-10-11 LAB — BASIC METABOLIC PANEL
Anion gap: 8 (ref 5–15)
BUN: 14 mg/dL (ref 6–20)
CO2: 23 mmol/L (ref 22–32)
Calcium: 7.8 mg/dL — ABNORMAL LOW (ref 8.9–10.3)
Chloride: 101 mmol/L (ref 98–111)
Creatinine, Ser: 0.81 mg/dL (ref 0.61–1.24)
GFR calc Af Amer: >60 mL/min (ref >60)
GFR calc non Af Amer: >60 mL/min (ref >60)
Glucose, Bld: 175 mg/dL — ABNORMAL HIGH (ref 70–99)
Potassium: 3.7 mmol/L (ref 3.5–5.1)
Sodium: 132 mmol/L — ABNORMAL LOW (ref 135–145)

## 2018-10-11 LAB — HCV COMMENT:

## 2018-10-11 LAB — HEPATITIS C ANTIBODY (REFLEX): HCV Ab: 0.4 s/co ratio (ref 0.0–0.9)

## 2018-10-11 LAB — VANCOMYCIN, RANDOM: Vancomycin Rm: 25

## 2018-10-11 LAB — VANCOMYCIN, TROUGH: Vancomycin Tr: 9 ug/mL — ABNORMAL LOW (ref 15–20)

## 2018-10-11 LAB — MRSA PCR SCREENING: MRSA by PCR: POSITIVE — AB

## 2018-10-11 MED ORDER — VANCOMYCIN HCL 10 G IV SOLR
1750.0000 mg | Freq: Two times a day (BID) | INTRAVENOUS | Status: DC
  Administered 2018-10-11 – 2018-10-13 (×4): 1750 mg via INTRAVENOUS
  Filled 2018-10-11 (×6): qty 1750

## 2018-10-11 MED ORDER — MUPIROCIN 2 % EX OINT
1.0000 "application " | TOPICAL_OINTMENT | Freq: Two times a day (BID) | CUTANEOUS | Status: DC
  Administered 2018-10-11 – 2018-10-13 (×5): 1 via NASAL
  Filled 2018-10-11: qty 22

## 2018-10-11 MED ORDER — CHLORHEXIDINE GLUCONATE CLOTH 2 % EX PADS
6.0000 | MEDICATED_PAD | Freq: Every day | CUTANEOUS | Status: DC
  Administered 2018-10-11 – 2018-10-13 (×3): 6 via TOPICAL

## 2018-10-11 NOTE — Progress Notes (Signed)
Attempted to get pt to return demonstrate giving himself his insulin injection at supper and pt refused. He stated I can give myself a shot, I said have you done it before and he stated no, but it's not problem. I will do it if I can't get the girl at home to give it to me. I did explain we would want to see him do it before he left. Cont to monitor. Carroll Kinds RN

## 2018-10-11 NOTE — Progress Notes (Signed)
Pharmacy Antibiotic Note  Carl Baker is a 58 y.o. male admitted on 10/08/2018 with MRSA bacteremia. Repeat blood cultures from 7/19 are no growth at < 24 hours and [id number] was negative for vegetations. A TEE is planned for tomorrow.   Pharmacy has been consulted for vancomycin dosing. WBC down to 16.6 and patient afebrile. SCr stable at 0.81.   Vancomycin trough today came back at 9 and vancomycin peak came back at 25 with predicted AUC of 432 which is towards the lower end of the goal range.   Plan: Increase Vancomycin to 1750 mg every 12 hours (Predicted AUC 502 Goal: 400-550)  Monitor renal function, repeat bcx, levels as appropriate F/U results of TEE tomorrow   Height: 6\' 2"  (188 cm) Weight: 232 lb (105.2 kg) IBW/kg (Calculated) : 82.2  Temp (24hrs), Avg:98.7 F (37.1 C), Min:98.3 F (36.8 C), Max:99.1 F (37.3 C)  Recent Labs  Lab 10/08/18 1057 10/08/18 1420 10/09/18 0225 10/10/18 0334 10/11/18 0554 10/11/18 1000  WBC 25.4*  --  26.2* 21.1* 16.6*  --   CREATININE 0.89  --  0.73 0.69 0.81  --   LATICACIDVEN  --  1.7  --   --   --   --   VANCOTROUGH  --   --   --   --   --  9*    Estimated Creatinine Clearance: 130.1 mL/min (by C-G formula based on SCr of 0.81 mg/dL).    No Known Allergies  Antimicrobials this admission: 7/17 Ceftriaxone X 1  7/18 Vanc>>  Dose adjustments this admission: 7/20: VT- 9 VP-25- AUC 432- Increase to 1750 Q 12 hours   Microbiology results: 7/19 repeat BCx: NGTD  7/17 BCx: 3/4 bottles + for MRSA  7/17  UCx: insignificant growth    Thank you for allowing pharmacy to be a part of this patient's care.  Jimmy Footman, PharmD, BCPS, [place] [facility] Clinical Pharmacist Phone: [phone] 10/11/2018 2:07 PM

## 2018-10-11 NOTE — Progress Notes (Signed)
[facility] for Infectious Disease  Date of Admission:  10/08/2018     Total days of antibiotics 4         ASSESSMENT/PLAN  Mr. Elting is a 58 year old male admitted with the chief complaint of right knee pain and left lower back pain radiating to his penis following a mechanical fall 2 weeks prior to admission and found to have MRSA bacteremia of unclear source.  CT scan imaging with concern for acute prostatitis with urine cultures with less than 10,000 colonies per milliliter and insignificant growth.  Currently on day 3 of vancomycin.  Transthoracic echocardiogram without evidence of vegetation and preserved heart valve function.  MRI of the right knee with focal myositis with small microabscesses that are too small to drain along with a small complex knee joint effusion with no synovial enhancement to suggest septic joint.  MRSA bacteremia -repeat blood cultures from 10/10/2018 without growth to date.  Source of infection remains unclear.  May need to consider transesophageal echocardiogram.  Continue to monitor cultures. Will need prolonged IV therapy and does have help at home to administer medications upon discharge.  {ICC line in the next 48 hours if cultures remain clear. Continue current dose of vancomycin  Prostatitis -MRSA not a typical cause of prostatitis, however no other growth of organisms noted on blood cultures or in urine.  No current urinary symptoms but does have some back pain. We will continue current dose of vancomycin.  Microabscesses of the right knee -too small for any fluid collection and currently maintained on vancomycin making obtaining cultures less likely.  No evidence of septic arthritis at present and no current pain. Unlikely this is the source of infection.   Therapeutic drug monitoring -renal function appears stable with no evidence of nephrotoxicity with vancomycin.  Continue dosing per pharmacy protocol.  Type 2 diabetes -most recent A1c greater  than 12 indicating poor control.  Certainly a contributing factor and may cause challenges in healing.  Continue management per primary team.    Principal Problem:   MRSA bacteremia Active Problems:   Acute prostatitis with hematuria   Dehydration with hyponatremia   Hypertension   Hypercholesteremia   Diabetes mellitus without complication (HCC)   Knee pain   . enoxaparin (LOVENOX) injection  40 mg Subcutaneous Q24H  . insulin aspart  0-15 Units Subcutaneous TID WC  . insulin aspart  0-5 Units Subcutaneous QHS  . insulin aspart  8 Units Subcutaneous TID WC  . insulin glargine  30 Units Subcutaneous Daily  . insulin starter kit- pen needles  1 kit Other Once  . lisinopril  20 mg Oral Daily    SUBJECTIVE:  Afebrile overnight with improved leukocytosis with most recent white blood cell count 16.6.  Repeat cultures from 10/10/2018 without growth to date.  No acute events overnight. Knee pain is improved and he is feeling better. Back pain improved as well.   No Known Allergies   Review of Systems: Review of Systems  Constitutional: Negative for chills, fever and weight loss.  Respiratory: Negative for cough, shortness of breath and wheezing.   Cardiovascular: Negative for chest pain and leg swelling.  Gastrointestinal: Negative for abdominal pain, constipation, diarrhea, nausea and vomiting.  Musculoskeletal: Positive for back pain.  Skin: Negative for rash.      OBJECTIVE: Vitals:   10/10/18 1020 10/10/18 1417 10/10/18 2119 10/11/18 0514  BP: 130/74 124/77 137/75 129/69  Pulse:  93 100 97  Resp:  16 18 18  Temp:  98.7 F (37.1 C) 98.3 F (36.8 C) 99.1 F (37.3 C)  TempSrc:      SpO2:  97% 95% 95%  Weight:      Height:       Body mass index is 29.79 kg/m.  Physical Exam Constitutional:      General: He is not in acute distress.    Appearance: He is well-developed.     Comments: Lying in bed with head of bed elevated; pleasant   Cardiovascular:     Rate  and Rhythm: Normal rate and regular rhythm.     Heart sounds: Normal heart sounds.  Pulmonary:     Effort: Pulmonary effort is normal.     Breath sounds: Normal breath sounds.  Abdominal:     General: Bowel sounds are normal. There is no distension.     Tenderness: There is no abdominal tenderness. There is no guarding or rebound.  Genitourinary:    Comments: Foley catheter present with yellow, clear urine.  Skin:    General: Skin is warm and dry.  Neurological:     Mental Status: He is alert.  Psychiatric:        Mood and Affect: Mood normal.     Lab Results Lab Results  Component Value Date   WBC 16.6 (H) 10/11/2018   HGB 11.5 (L) 10/11/2018   HCT 33.7 (L) 10/11/2018   MCV 87.1 10/11/2018   PLT 298 10/11/2018    Lab Results  Component Value Date   CREATININE 0.81 10/11/2018   BUN 14 10/11/2018   NA 132 (L) 10/11/2018   K 3.7 10/11/2018   CL 101 10/11/2018   CO2 23 10/11/2018    Lab Results  Component Value Date   ALT 22 10/08/2018   AST 15 10/08/2018   ALKPHOS 100 10/08/2018   BILITOT 0.9 10/08/2018     Microbiology: Recent Results (from the past 240 hour(s))  SARS Coronavirus 2 (CEPHEID - Performed in [place] hospital lab), Hosp Order     Status: None   Collection Time: 10/08/18  2:20 PM   Specimen: Urine, Catheterized; Nasopharyngeal  Result Value Ref Range Status   SARS Coronavirus 2 NEGATIVE NEGATIVE Final    Comment: (NOTE) If result is NEGATIVE SARS-CoV-2 target nucleic acids are NOT DETECTED. The SARS-CoV-2 RNA is generally detectable in upper and lower  respiratory specimens during the acute phase of infection. The lowest  concentration of SARS-CoV-2 viral copies this assay can detect is 250  copies / mL. A negative result does not preclude SARS-CoV-2 infection  and should not be used as the sole basis for treatment or other  patient management decisions.  A negative result may occur with  improper specimen collection / handling, submission  of specimen other  than nasopharyngeal swab, presence of viral mutation(s) within the  areas targeted by this assay, and inadequate number of viral copies  (<250 copies / mL). A negative result must be combined with clinical  observations, patient history, and epidemiological information. If result is POSITIVE SARS-CoV-2 target nucleic acids are DETECTED. The SARS-CoV-2 RNA is generally detectable in upper and lower  respiratory specimens dur ing the acute phase of infection.  Positive  results are indicative of active infection with SARS-CoV-2.  Clinical  correlation with patient history and other diagnostic information is  necessary to determine patient infection status.  Positive results do  not rule out bacterial infection or co-infection with other viruses. If result is PRESUMPTIVE POSTIVE SARS-CoV-2 nucleic acids MAY  BE PRESENT.   A presumptive positive result was obtained on the submitted specimen  and confirmed on repeat testing.  While 2019 novel coronavirus  (SARS-CoV-2) nucleic acids may be present in the submitted sample  additional confirmatory testing may be necessary for epidemiological  and / or clinical management purposes  to differentiate between  SARS-CoV-2 and other Sarbecovirus currently known to infect humans.  If clinically indicated additional testing with an alternate test  methodology [phone]) is advised. The SARS-CoV-2 RNA is generally  detectable in upper and lower respiratory sp ecimens during the acute  phase of infection. The expected result is Negative. Fact Sheet for Patients:  StrictlyIdeas.no Fact Sheet for Healthcare Providers: BankingDealers.co.za This test is not yet approved or cleared by the [place] FDA and has been authorized for detection and/or diagnosis of SARS-CoV-2 by FDA under an Emergency Use Authorization (EUA).  This EUA will remain in effect (meaning this test can be used) for  the duration of the COVID-19 declaration under Section 564(b)(1) of the Act, 21 U.S.C. section 360bbb-3(b)(1), unless the authorization is terminated or revoked sooner. Performed at [facility], [place] [address] [place], [place] [address]   Urine culture     Status: Abnormal   Collection Time: 10/08/18  2:20 PM   Specimen: Urine, Random  Result Value Ref Range Status   Specimen Description URINE, RANDOM  Final   Special Requests NONE  Final   Culture (A)  Final    <10,000 COLONIES/mL INSIGNIFICANT GROWTH Performed at [facility], [place] [address] [place], [place] [address]    Report Status 10/09/2018 FINAL  Final  Culture, blood (routine x 2)     Status: Abnormal   Collection Time: 10/08/18  2:54 PM   Specimen: BLOOD RIGHT HAND  Result Value Ref Range Status   Specimen Description BLOOD RIGHT HAND  Final   Special Requests   Final    BOTTLES DRAWN AEROBIC ONLY Blood Culture results may not be optimal due to an inadequate volume of blood received in culture bottles   Culture  Setup Time   Final    AEROBIC BOTTLE ONLY GRAM POSITIVE COCCI CRITICAL VALUE NOTED.  VALUE IS CONSISTENT WITH PREVIOUSLY REPORTED AND CALLED VALUE.    Culture (A)  Final    STAPHYLOCOCCUS AUREUS SUSCEPTIBILITIES PERFORMED ON PREVIOUS CULTURE WITHIN THE LAST 5 DAYS. Performed at [facility], [place] [address] [place], [place] [address]    Report Status 10/11/2018 FINAL  Final  Culture, blood (routine x 2)     Status: Abnormal   Collection Time: 10/08/18  2:55 PM   Specimen: BLOOD  Result Value Ref Range Status   Specimen Description BLOOD ARM  Final   Special Requests   Final    BOTTLES DRAWN AEROBIC AND ANAEROBIC Blood Culture adequate volume   Culture  Setup Time   Final    IN BOTH AEROBIC AND ANAEROBIC BOTTLES GRAM POSITIVE COCCI CRITICAL RESULT CALLED TO, READ BACK BY AND VERIFIED WITHLenna Sciara [facility] 10/09/18 3664 JDW Performed at [facility], [place] [address],  [place], [place] [address]    Culture STAPHYLOCOCCUS AUREUS (A)  Final   Report Status 10/11/2018 FINAL  Final   Organism ID, Bacteria STAPHYLOCOCCUS AUREUS  Final      Susceptibility   Staphylococcus aureus - MIC*    CIPROFLOXACIN >=8 RESISTANT Resistant     ERYTHROMYCIN >=8 RESISTANT Resistant     GENTAMICIN <=0.5 SENSITIVE Sensitive  OXACILLIN >=4 RESISTANT Resistant     TETRACYCLINE <=1 SENSITIVE Sensitive     VANCOMYCIN 1 SENSITIVE Sensitive     TRIMETH/SULFA <=10 SENSITIVE Sensitive     CLINDAMYCIN <=0.25 SENSITIVE Sensitive     RIFAMPIN <=0.5 SENSITIVE Sensitive     Inducible Clindamycin NEGATIVE Sensitive     * STAPHYLOCOCCUS AUREUS  Blood Culture ID Panel (Reflexed)     Status: Abnormal   Collection Time: 10/08/18  2:55 PM  Result Value Ref Range Status   Enterococcus species NOT DETECTED NOT DETECTED Final   Listeria monocytogenes NOT DETECTED NOT DETECTED Final   Staphylococcus species DETECTED (A) NOT DETECTED Final    Comment: CRITICAL RESULT CALLED TO, READ BACK BY AND VERIFIED WITH: J LEDFORD [facility] 10/09/18 7867 JDW    Staphylococcus aureus (BCID) DETECTED (A) NOT DETECTED Final    Comment: Methicillin (oxacillin)-resistant Staphylococcus aureus (MRSA). MRSA is predictably resistant to beta-lactam antibiotics (except ceftaroline). Preferred therapy is vancomycin unless clinically contraindicated. Patient requires contact precautions if  hospitalized. CRITICAL RESULT CALLED TO, READ BACK BY AND VERIFIED WITH: J LEDFORD [facility] 10/09/18 6720 JDW    Methicillin resistance DETECTED (A) NOT DETECTED Final    Comment: CRITICAL RESULT CALLED TO, READ BACK BY AND VERIFIED WITH: J LEDFORD [facility] 10/09/18 9470 JDW    Streptococcus species NOT DETECTED NOT DETECTED Final   Streptococcus agalactiae NOT DETECTED NOT DETECTED Final   Streptococcus pneumoniae NOT DETECTED NOT DETECTED Final   Streptococcus pyogenes NOT DETECTED NOT DETECTED Final   Acinetobacter baumannii NOT  DETECTED NOT DETECTED Final   Enterobacteriaceae species NOT DETECTED NOT DETECTED Final   Enterobacter cloacae complex NOT DETECTED NOT DETECTED Final   Escherichia coli NOT DETECTED NOT DETECTED Final   Klebsiella oxytoca NOT DETECTED NOT DETECTED Final   Klebsiella pneumoniae NOT DETECTED NOT DETECTED Final   Proteus species NOT DETECTED NOT DETECTED Final   Serratia marcescens NOT DETECTED NOT DETECTED Final   Haemophilus influenzae NOT DETECTED NOT DETECTED Final   Neisseria meningitidis NOT DETECTED NOT DETECTED Final   Pseudomonas aeruginosa NOT DETECTED NOT DETECTED Final   Candida albicans NOT DETECTED NOT DETECTED Final   Candida glabrata NOT DETECTED NOT DETECTED Final   Candida krusei NOT DETECTED NOT DETECTED Final   Candida parapsilosis NOT DETECTED NOT DETECTED Final   Candida tropicalis NOT DETECTED NOT DETECTED Final    Comment: Performed at [facility], [place] [address] [place], [place] [address]  Culture, blood (Routine X 2) w Reflex to ID Panel     Status: None (Preliminary result)   Collection Time: 10/10/18  2:15 PM   Specimen: BLOOD  Result Value Ref Range Status   Specimen Description BLOOD RIGHT ANTECUBITAL  Final   Special Requests AEROBIC BOTTLE ONLY Blood Culture adequate volume  Final   Culture   Final    NO GROWTH < 24 HOURS Performed at [facility], [place] [address], [place], [place] [address]    Report Status PENDING  Incomplete  Culture, blood (Routine X 2) w Reflex to ID Panel     Status: None (Preliminary result)   Collection Time: 10/10/18  2:15 PM   Specimen: BLOOD  Result Value Ref Range Status   Specimen Description BLOOD RIGHT ANTECUBITAL  Final   Special Requests AEROBIC BOTTLE ONLY Blood Culture adequate volume  Final   Culture   Final    NO GROWTH < 24 HOURS Performed at [facility], [place] [address], [place], [place]  [address]    Report Status PENDING  Incomplete     Terri Piedra, NP [facility] [phone] Pager  10/11/2018  9:59 AM

## 2018-10-11 NOTE — Progress Notes (Signed)
Inpatient Diabetes Program Recommendations  AACE/ADA: New Consensus Statement on Inpatient Glycemic Control (2015)  Target Ranges:  Prepandial:   less than 140 mg/dL      Peak postprandial:   less than 180 mg/dL (1-2 hours)      Critically ill patients:  140 - 180 mg/dL   Lab Results  Component Value Date   GLUCAP 157 (H) 10/11/2018   HGBA1C 12.8 (H) 10/08/2018    Review of Glycemic Control Results for ALFIE, RIDEAUX (MRN [id number]) as of 10/11/2018 15:06  Ref. Range 10/10/2018 16:44 10/10/2018 21:18 10/11/2018 07:55 10/11/2018 12:11  Glucose-Capillary Latest Ref Range: 70 - 99 mg/dL 232 (H) 197 (H) 168 (H) 157 (H)   Diabetes history: DM 2 Outpatient Diabetes medications:  Actos 30 mg daily, Amaryl 4 mg daily Current orders for Inpatient glycemic control:  Lantus 30 units daily, Novolog 8 units tid with meals, Novolog moderate tid with meals and HS  Inpatient Diabetes Program Recommendations:    Spoke with patient by phone regarding current A1C and glycemic control.  He states that he has not been taking his medications for diabetes for the past 3 months.  I discussed his potential need for insulin based on his A1C of 12.8%.  He states he will take insulin if he has too, but his goal is to get back on only the pills.  He does not have medication coverage but states that he uses a discount card from [employer]. Both Actos and Amaryl on are the 4$ list.  Explained that [facility] also has insulin that is cheaper.  Patient verbalized understanding.  He does have a meter but has run out of strips.  We also discussed the [facility] Reli-on meter.  He states that he will check into it.  He is familiar with low blood sugars stating, "I get clammy" and sometimes have to back off my medications. Discussed goal blood sugar of 100-180 mg/dL. Blood sugars are currently well controlled.  Case management consult pending.   May need affordable insulin regimen at d/c such as 70/30 or NPH (Reli-on brand)?  Will ask  RN to begin insulin teaching with patient as well so that he is familiar and comfortable with administration at d/c.   Thanks,  Adah Perl, RN, BC-ADM Inpatient Diabetes Coordinator Pager [phone] (8a-5p)

## 2018-10-11 NOTE — Progress Notes (Signed)
Occupational Therapy Evaluation Patient Details Name: Carl Baker MRN: [id number] DOB: June 27, 1960 Today's Date: 10/11/2018    History of Present Illness Patient is a 58 y/o male presenting to the ED on 10/08/2018 with primary complaints of Bowel/bladder incontinence. Past medical history significant of HTN; HLD; and DM. Admitted for Acute prostatitis.   Clinical Impression   PTA, pt was living at home alone, pt reports he was independent with ADL/IADL and functional mobility. Pt currently completes ADL and functional mobility with minguard. Noted minor instability during ambulation with sudden stops, pt reports he "feels weak from laying in the bed". Due to decline in current level of function, pt would benefit from acute OT to address established goals to facilitate safe D/C to venue listed below. Anticipate pt will continue to progress toward baseline with increased mobility. Will continue to follow acutely.     Follow Up Recommendations  No OT follow up    Equipment Recommendations  None recommended by OT    Recommendations for Other Services       Precautions / Restrictions Precautions Precautions: Fall Restrictions Weight Bearing Restrictions: No      Mobility Bed Mobility Overal bed mobility: Modified Independent                Transfers Overall transfer level: Needs assistance Equipment used: None Transfers: Sit to/from Stand Sit to Stand: Supervision         General transfer comment: for safety    Balance Overall balance assessment: Mild deficits observed, not formally tested(minguard;minor instability with sudden stops)                                         ADL either performed or assessed with clinical judgement   ADL Overall ADL's : Needs assistance/impaired                                     Functional mobility during ADLs: Min guard General ADL Comments: pt completed ADL with minguard A;functional  mobility minguard no AD required;minor instability noted during ambulation and pt with sudden stops     Vision Patient Visual Report: No change from baseline       Perception     Praxis      Pertinent Vitals/Pain Pain Assessment: No/denies pain Pain Intervention(s): Monitored during session     Hand Dominance Right   Extremity/Trunk Assessment Upper Extremity Assessment Upper Extremity Assessment: Overall WFL for tasks assessed   Lower Extremity Assessment Lower Extremity Assessment: Overall WFL for tasks assessed   Cervical / Trunk Assessment Cervical / Trunk Assessment: Normal   Communication Communication Communication: No difficulties   Cognition Arousal/Alertness: Awake/alert Behavior During Therapy: WFL for tasks assessed/performed Overall Cognitive Status: Within Functional Limits for tasks assessed                                     General Comments  VSS throughout    Exercises     Shoulder Instructions      Home Living Family/patient expects to be discharged to:: Private residence Living Arrangements: Alone Available Help at Discharge: Friend(s);Available PRN/intermittently Type of Home: Apartment Home Access: Stairs to enter Entrance Stairs-Number of Steps: 1 step-walkway-1step (about 8inch step) Entrance Stairs-Rails: None Home Layout:  One level     Bathroom Shower/Tub: Chief Strategy OfficerTub/shower unit   Bathroom Toilet: Standard     Home Equipment: None   Additional Comments: reports his girlfriend will come assist after d/c      Prior Functioning/Environment Level of Independence: Independent        Comments: independent with all ADL/IADL        OT Problem List: Decreased activity tolerance;Decreased safety awareness;Decreased knowledge of use of DME or AE      OT Treatment/Interventions: Self-care/ADL training;Patient/family education;Balance training    OT Goals(Current goals can be found in the care plan section) Acute  Rehab OT Goals Patient Stated Goal: to go home OT Goal Formulation: With patient Time For Goal Achievement: 10/25/18 Potential to Achieve Goals: Good ADL Goals Pt Will Perform Grooming: Independently Pt Will Perform Upper Body Dressing: Independently Pt Will Perform Lower Body Dressing: Independently Pt Will Transfer to Toilet: Independently Pt Will Perform Tub/Shower Transfer: Tub transfer;Independently  OT Frequency: Min 2X/week   Barriers to D/C:            Co-evaluation              AM-PAC OT "6 Clicks" Daily Activity     Outcome Measure Help from another person eating meals?: None Help from another person taking care of personal grooming?: A Little Help from another person toileting, which includes using toliet, bedpan, or urinal?: A Little Help from another person bathing (including washing, rinsing, drying)?: A Little Help from another person to put on and taking off regular upper body clothing?: A Little Help from another person to put on and taking off regular lower body clothing?: A Little 6 Click Score: 19   End of Session Nurse Communication: Mobility status  Activity Tolerance: Patient tolerated treatment well Patient left: in bed;with call bell/phone within reach(with lab team present)  OT Visit Diagnosis: Other abnormalities of gait and mobility (R26.89);History of falling (Z91.81)                Time: [id number] OT Time Calculation (min): 14 min Charges:  OT General Charges $OT Visit: 1 Visit OT Evaluation $OT Eval Low Complexity: 1 Low  Diona Brownereresa Thy Gullikson OTR/L Acute Rehabilitation Services Office: [phone]   Rebeca Alerteresa J Ivey Cina 10/11/2018, 2:22 PM

## 2018-10-11 NOTE — Progress Notes (Addendum)
    [facility] has been requested to perform a transesophageal echocardiogram on Carl Baker for Bacteremia.  After careful review of history and examination, the risks and benefits of transesophageal echocardiogram have been explained including risks of esophageal damage, perforation (1:10,000 risk), bleeding, pharyngeal hematoma as well as other potential complications associated with conscious sedation including aspiration, arrhythmia, respiratory failure and death. Alternatives to treatment were discussed, questions were answered. Patient is willing to proceed.  TEE - Dr. Margaretann Loveless 10/12/2018 @ 10am. NPO after midnight. Meds with sips.   Leanor Kail, PA-C 10/11/2018 12:30 PM

## 2018-10-11 NOTE — H&P (View-Only) (Signed)
PROGRESS NOTE    Carl Baker  TIR:[id number] DOB: 1961-02-27 DOA: 10/08/2018 PCP: Patient, No Pcp Per     Brief Narrative:  Carl Baker is a 58 y.o. male with medical history significant of HTN, HLD, and DM presenting with bowel/bladder incontinence.  Symptoms started Monday 2 1/2 weeks ago.  He completed 4 days of antibiotics without improvement.  His urine is orange, concerning for blood in the urine.  Sexually active until about a month ago, 1 male partner.  +fever, taking Tylenol XS for pain.  It burns "like fire" when he voids.  In the emergency department, CT revealed prostatitis. Blood cultures returned with MRSA.  Patient with Foley catheter in place  New events last 24 hours / Subjective: States that pain in the medial right knee has improved, able to walk around without any pain in his lower extremities.  Wondering when he can go home.  Assessment & Plan:   Principal Problem:   MRSA bacteremia Active Problems:   Acute prostatitis with hematuria   Dehydration with hyponatremia   Hypertension   Hypercholesteremia   Diabetes mellitus without complication (HCC)   Knee pain   Sepsis secondary to MRSA bacteremia -Appreciate infectious disease -Echocardiogram completed, did not reveal vegetation -Vancomycin -Repeat blood culture pending -TEE, contacted cardiology service  Acute prostatitis -Urine culture negative -Vancomycin as above -Will need outpatient follow-up with urology  Right medial knee pain -MRI right knee reveal focal myositis with microabscess, too small to drain.  ID does not feel this is source of his MRSA bacteremia.  Also showed focal peritendinitis, small joint effusion, no definite synovial enhancement to further suggest septic joint -Pain has improved per patient  Lateral meniscal tear -Patient denies any pain in this area, no issues with ambulation -May follow-up with outpatient orthopedic surgery  Uncontrolled type 2 diabetes, with  hyperglycemia -Hemoglobin A1c 12.8 -Patient states that he has not been compliant with his medications, ran out of them -Diabetic coordinator consult -Lantus, sliding scale insulin  Hyponatremia -Improving  Hypertension -Continue lisinopril  Degenerative changes lumbar and thoracic spine -Pain control   DVT prophylaxis: Lovenox Code Status: Full Family Communication: None Disposition Plan: Pending clinical improvement, clearance of bacteremia, TEE   Consultants:   Infectious disease  Procedures:   None  Antimicrobials:  Anti-infectives (From admission, onward)   Start     Dose/Rate Route Frequency Ordered Stop   10/09/18 2200  vancomycin (VANCOCIN) 1,500 mg in sodium chloride 0.9 % 500 mL IVPB     1,500 mg 250 mL/hr over 120 Minutes Intravenous Every 12 hours 10/09/18 0618     10/09/18 1500  cefTRIAXone (ROCEPHIN) 2 g in sodium chloride 0.9 % 100 mL IVPB  Status:  Discontinued     2 g 200 mL/hr over 30 Minutes Intravenous Every 24 hours 10/08/18 1512 10/09/18 1326   10/09/18 0630  vancomycin (VANCOCIN) 2,000 mg in sodium chloride 0.9 % 500 mL IVPB     2,000 mg 250 mL/hr over 120 Minutes Intravenous  Once 10/09/18 0618 10/09/18 1048   10/08/18 1415  cefTRIAXone (ROCEPHIN) 2 g in sodium chloride 0.9 % 100 mL IVPB     2 g 200 mL/hr over 30 Minutes Intravenous  Once 10/08/18 1413 10/08/18 1517       Objective: Vitals:   10/10/18 1020 10/10/18 1417 10/10/18 2119 10/11/18 0514  BP: 130/74 124/77 137/75 129/69  Pulse:  93 100 97  Resp:  '16 18 18  ' Temp:  98.7 F (37.1 C) 98.3 F (  36.8 C) 99.1 F (37.3 C)  TempSrc:      SpO2:  97% 95% 95%  Weight:      Height:        Intake/Output Summary (Last 24 hours) at 10/11/2018 1221 Last data filed at 10/11/2018 0500 Gross per 24 hour  Intake 3538.33 ml  Output 2050 ml  Net 1488.33 ml   Filed Weights   10/08/18 1135  Weight: 105.2 kg    Examination: General exam: Appears calm and comfortable  Respiratory  system: Clear to auscultation. Respiratory effort normal. Cardiovascular system: S1 & S2 heard, RRR. No JVD, murmurs, rubs, gallops or clicks. No pedal edema. Gastrointestinal system: Abdomen is nondistended, soft and nontender. No organomegaly or masses felt. Normal bowel sounds heard. Central nervous system: Alert and oriented. No focal neurological deficits. Extremities: Symmetric 5 x 5 power. Skin: No rashes, lesions or ulcers Psychiatry: Judgement and insight appear normal. Mood & affect appropriate.   Data Reviewed: I have personally reviewed following labs and imaging studies  CBC: Recent Labs  Lab 10/08/18 1057 10/09/18 0225 10/10/18 0334 10/11/18 0554  WBC 25.4* 26.2* 21.1* 16.6*  NEUTROABS 22.7*  --   --   --   HGB 14.4 12.7* 12.1* 11.5*  HCT 43.1 38.1* 35.8* 33.7*  MCV 89.2 88.6 88.2 87.1  PLT 266 296 287 536   Basic Metabolic Panel: Recent Labs  Lab 10/08/18 1057 10/09/18 0225 10/10/18 0334 10/11/18 0554  NA 128* 129* 130* 132*  K 4.3 3.9 3.5 3.7  CL 96* 97* 98 101  CO2 19* 21* 22 23  GLUCOSE 437* 280* 256* 175*  BUN '12 11 10 14  ' CREATININE 0.89 0.73 0.69 0.81  CALCIUM 8.8* 8.2* 7.8* 7.8*   GFR: Estimated Creatinine Clearance: 130.1 mL/min (by C-G formula based on SCr of 0.81 mg/dL). Liver Function Tests: Recent Labs  Lab 10/08/18 1057  AST 15  ALT 22  ALKPHOS 100  BILITOT 0.9  PROT 6.8  ALBUMIN 2.5*   No results for input(s): LIPASE, AMYLASE in the last 168 hours. No results for input(s): AMMONIA in the last 168 hours. Coagulation Profile: No results for input(s): INR, PROTIME in the last 168 hours. Cardiac Enzymes: No results for input(s): CKTOTAL, CKMB, CKMBINDEX, TROPONINI in the last 168 hours. BNP (last 3 results) No results for input(s): PROBNP in the last 8760 hours. HbA1C: Recent Labs    10/08/18 1811  HGBA1C 12.8*   CBG: Recent Labs  Lab 10/10/18 1254 10/10/18 1644 10/10/18 2118 10/11/18 0755 10/11/18 1211  GLUCAP 243*  232* 197* 168* 157*   Lipid Profile: No results for input(s): CHOL, HDL, LDLCALC, TRIG, CHOLHDL, LDLDIRECT in the last 72 hours. Thyroid Function Tests: No results for input(s): TSH, T4TOTAL, FREET4, T3FREE, THYROIDAB in the last 72 hours. Anemia Panel: No results for input(s): VITAMINB12, FOLATE, FERRITIN, TIBC, IRON, RETICCTPCT in the last 72 hours. Sepsis Labs: Recent Labs  Lab 10/08/18 1420  LATICACIDVEN 1.7    Recent Results (from the past 240 hour(s))  SARS Coronavirus 2 (CEPHEID - Performed in [facility] hospital lab), Hosp Order     Status: None   Collection Time: 10/08/18  2:20 PM   Specimen: Urine, Catheterized; Nasopharyngeal  Result Value Ref Range Status   SARS Coronavirus 2 NEGATIVE NEGATIVE Final    Comment: (NOTE) If result is NEGATIVE SARS-CoV-2 target nucleic acids are NOT DETECTED. The SARS-CoV-2 RNA is generally detectable in upper and lower  respiratory specimens during the acute phase of infection. The lowest  concentration of SARS-CoV-2 viral copies this assay can detect is 250  copies / mL. A negative result does not preclude SARS-CoV-2 infection  and should not be used as the sole basis for treatment or other  patient management decisions.  A negative result may occur with  improper specimen collection / handling, submission of specimen other  than nasopharyngeal swab, presence of viral mutation(s) within the  areas targeted by this assay, and inadequate number of viral copies  (<250 copies / mL). A negative result must be combined with clinical  observations, patient history, and epidemiological information. If result is POSITIVE SARS-CoV-2 target nucleic acids are DETECTED. The SARS-CoV-2 RNA is generally detectable in upper and lower  respiratory specimens dur ing the acute phase of infection.  Positive  results are indicative of active infection with SARS-CoV-2.  Clinical  correlation with patient history and other diagnostic information is    necessary to determine patient infection status.  Positive results do  not rule out bacterial infection or co-infection with other viruses. If result is PRESUMPTIVE POSTIVE SARS-CoV-2 nucleic acids MAY BE PRESENT.   A presumptive positive result was obtained on the submitted specimen  and confirmed on repeat testing.  While 2019 novel coronavirus  (SARS-CoV-2) nucleic acids may be present in the submitted sample  additional confirmatory testing may be necessary for epidemiological  and / or clinical management purposes  to differentiate between  SARS-CoV-2 and other Sarbecovirus currently known to infect humans.  If clinically indicated additional testing with an alternate test  methodology [phone]) is advised. The SARS-CoV-2 RNA is generally  detectable in upper and lower respiratory sp ecimens during the acute  phase of infection. The expected result is Negative. Fact Sheet for Patients:  StrictlyIdeas.no Fact Sheet for Healthcare Providers: BankingDealers.co.za This test is not yet approved or cleared by the [place] FDA and has been authorized for detection and/or diagnosis of SARS-CoV-2 by FDA under an Emergency Use Authorization (EUA).  This EUA will remain in effect (meaning this test can be used) for the duration of the COVID-19 declaration under Section 564(b)(1) of the Act, 21 U.S.C. section 360bbb-3(b)(1), unless the authorization is terminated or revoked sooner. Performed at [facility], [place] [address], [place], [place] [address]   Urine culture     Status: Abnormal   Collection Time: 10/08/18  2:20 PM   Specimen: Urine, Random  Result Value Ref Range Status   Specimen Description URINE, RANDOM  Final   Special Requests NONE  Final   Culture (A)  Final    <10,000 COLONIES/mL INSIGNIFICANT GROWTH Performed at [facility], [place] [address], [place], [place] [address]    Report Status 10/09/2018 FINAL   Final  Culture, blood (routine x 2)     Status: Abnormal   Collection Time: 10/08/18  2:54 PM   Specimen: BLOOD RIGHT HAND  Result Value Ref Range Status   Specimen Description BLOOD RIGHT HAND  Final   Special Requests   Final    BOTTLES DRAWN AEROBIC ONLY Blood Culture results may not be optimal due to an inadequate volume of blood received in culture bottles   Culture  Setup Time   Final    AEROBIC BOTTLE ONLY GRAM POSITIVE COCCI CRITICAL VALUE NOTED.  VALUE IS CONSISTENT WITH PREVIOUSLY REPORTED AND CALLED VALUE.    Culture (A)  Final    STAPHYLOCOCCUS AUREUS SUSCEPTIBILITIES PERFORMED ON PREVIOUS CULTURE WITHIN THE LAST 5 DAYS. Performed at [facility], [address] [place], [place] [address]    Report Status 10/11/2018 FINAL  Final  Culture, blood (routine x 2)     Status: Abnormal   Collection Time: 10/08/18  2:55 PM   Specimen: BLOOD  Result Value Ref Range Status   Specimen Description BLOOD ARM  Final   Special Requests   Final    BOTTLES DRAWN AEROBIC AND ANAEROBIC Blood Culture adequate volume   Culture  Setup Time   Final    IN BOTH AEROBIC AND ANAEROBIC BOTTLES GRAM POSITIVE COCCI CRITICAL RESULT CALLED TO, READ BACK BY AND VERIFIED WITHLenna Sciara [facility] 10/09/18 7124 JDW Performed at [facility], [place] [address], [place],  [address]    Culture STAPHYLOCOCCUS AUREUS (A)  Final   Report Status 10/11/2018 FINAL  Final   Organism ID, Bacteria STAPHYLOCOCCUS AUREUS  Final      Susceptibility   Staphylococcus aureus - MIC*    CIPROFLOXACIN >=8 RESISTANT Resistant     ERYTHROMYCIN >=8 RESISTANT Resistant     GENTAMICIN <=0.5 SENSITIVE Sensitive     OXACILLIN >=4 RESISTANT Resistant     TETRACYCLINE <=1 SENSITIVE Sensitive     VANCOMYCIN 1 SENSITIVE Sensitive     TRIMETH/SULFA <=10 SENSITIVE Sensitive     CLINDAMYCIN <=0.25 SENSITIVE Sensitive     RIFAMPIN <=0.5 SENSITIVE Sensitive     Inducible Clindamycin NEGATIVE Sensitive     *  STAPHYLOCOCCUS AUREUS  Blood Culture ID Panel (Reflexed)     Status: Abnormal   Collection Time: 10/08/18  2:55 PM  Result Value Ref Range Status   Enterococcus species NOT DETECTED NOT DETECTED Final   Listeria monocytogenes NOT DETECTED NOT DETECTED Final   Staphylococcus species DETECTED (A) NOT DETECTED Final    Comment: CRITICAL RESULT CALLED TO, READ BACK BY AND VERIFIED WITH: J LEDFORD [facility] 10/09/18 8338 JDW    Staphylococcus aureus (BCID) DETECTED (A) NOT DETECTED Final    Comment: Methicillin (oxacillin)-resistant Staphylococcus aureus (MRSA). MRSA is predictably resistant to beta-lactam antibiotics (except ceftaroline). Preferred therapy is vancomycin unless clinically contraindicated. Patient requires contact precautions if  hospitalized. CRITICAL RESULT CALLED TO, READ BACK BY AND VERIFIED WITH: J LEDFORD [facility] 10/09/18 2505 JDW    Methicillin resistance DETECTED (A) NOT DETECTED Final    Comment: CRITICAL RESULT CALLED TO, READ BACK BY AND VERIFIED WITH: J LEDFORD [facility] 10/09/18 3976 JDW    Streptococcus species NOT DETECTED NOT DETECTED Final   Streptococcus agalactiae NOT DETECTED NOT DETECTED Final   Streptococcus pneumoniae NOT DETECTED NOT DETECTED Final   Streptococcus pyogenes NOT DETECTED NOT DETECTED Final   Acinetobacter baumannii NOT DETECTED NOT DETECTED Final   Enterobacteriaceae species NOT DETECTED NOT DETECTED Final   Enterobacter cloacae complex NOT DETECTED NOT DETECTED Final   Escherichia coli NOT DETECTED NOT DETECTED Final   Klebsiella oxytoca NOT DETECTED NOT DETECTED Final   Klebsiella pneumoniae NOT DETECTED NOT DETECTED Final   Proteus species NOT DETECTED NOT DETECTED Final   Serratia marcescens NOT DETECTED NOT DETECTED Final   Haemophilus influenzae NOT DETECTED NOT DETECTED Final   Neisseria meningitidis NOT DETECTED NOT DETECTED Final   Pseudomonas aeruginosa NOT DETECTED NOT DETECTED Final   Candida albicans NOT DETECTED NOT DETECTED  Final   Candida glabrata NOT DETECTED NOT DETECTED Final   Candida krusei NOT DETECTED NOT DETECTED Final   Candida parapsilosis NOT DETECTED NOT DETECTED Final   Candida tropicalis NOT DETECTED NOT DETECTED Final    Comment: Performed at [facility], [place] [address] [place], [place] [address]  Culture, blood (Routine X 2) w Reflex to ID Panel     Status: None (Preliminary result)   Collection Time: 10/10/18  2:15 PM   Specimen: BLOOD  Result Value Ref Range Status   Specimen Description BLOOD RIGHT ANTECUBITAL  Final   Special Requests AEROBIC BOTTLE ONLY Blood Culture adequate volume  Final   Culture   Final    NO GROWTH < 24 HOURS Performed at [facility], [place] [address] [place], [place] [address]    Report Status PENDING  Incomplete  Culture, blood (Routine X 2) w Reflex to ID Panel     Status: None (Preliminary result)   Collection Time: 10/10/18  2:15 PM   Specimen: BLOOD  Result Value Ref Range Status   Specimen Description BLOOD RIGHT ANTECUBITAL  Final   Special Requests AEROBIC BOTTLE ONLY Blood Culture adequate volume  Final   Culture   Final    NO GROWTH < 24 HOURS Performed at [facility], [place] [address] [place], [place] [address]    Report Status PENDING  Incomplete  MRSA PCR Screening     Status: Abnormal   Collection Time: 10/11/18  9:13 AM   Specimen: Nasopharyngeal  Result Value Ref Range Status   MRSA by PCR POSITIVE (A) NEGATIVE Final    Comment:        The GeneXpert MRSA Assay (FDA approved for NASAL specimens only), is one component of a comprehensive MRSA colonization surveillance program. It is not intended to diagnose MRSA infection nor to guide or monitor treatment for MRSA infections. RESULT CALLED TO, READ BACK BY AND VERIFIED WITH: Burna Mortimer RN 11:05 10/11/18 ([email or web address]) Performed at [facility], [place] [address] [place], [place] [address]       Radiology Studies: Mr Knee Right W Wo Contrast  Result Date:  10/10/2018 CLINICAL DATA:  Medial knee pain.  Bacteremia. EXAM: MRI OF THE RIGHT KNEE WITHOUT AND WITH CONTRAST TECHNIQUE: Multiplanar, multisequence MR imaging of the knee was performed before and after the administration of intravenous contrast. CONTRAST:  10 cc Gadavist COMPARISON:  None. FINDINGS: MENISCI Medial meniscus: Grade 1 signal in the posterior horn and midbody without definite extension to a meniscal surface. Lateral meniscus: Grade 3 oblique tear of the posterior horn extends to the inferior meniscal surface as on image 15/6. LIGAMENTS Cruciates:  Unremarkable Collaterals:  Proximal popliteus tendinopathy. CARTILAGE Patellofemoral:  Unremarkable Medial: Moderate degenerative chondral thinning. Mild chondral surface irregularity posteriorly along the medial femoral condyle. Lateral:  Mild to moderate degenerative chondral thinning. Joint: Small knee joint effusion. No definite synovial enhancement to suggest septic joint, although there is some accentuated T1 signal in the joint fluid which could indicate mild complexity. Popliteal Fossa: Mild focal edema and enhancement along a portion of the semitendinosus tendon on image 5/10. Extensor Mechanism: Abnormal T2 signal and enhancement posteriorly in the vastus medialis muscle inferiorly with adjacent subcutaneous edema and enhancement, and possible small microabscess formation measuring about 1.6 by 0.4 by 0.7 cm (volume = 0.2 cm^3) Mild prepatellar subcutaneous edema. Bones: No significant extra-articular osseous abnormalities identified. Other: No supplemental non-categorized findings. IMPRESSION: 1. Focal myositis potentially with small microabscesses posteriorly in the distal vastus medialis muscle. The area of incipient abscess formation is too small to drain, only about 0.2 cubic cm at this time. 2. Focal peritendinitis around a portion of the semitendinosus tendon, image 5/10. 3. Small but potentially complex knee  joint effusion. No definite  synovial enhancement in the knee joint to further suggest septic joint. 4. Grade 3 oblique tear of the posterior horn lateral meniscus extending to the inferior meniscal surface. 5. Mild to moderate degrees of degenerative chondral thinning in the medial and lateral compartments. 6. Prepatellar subcutaneous edema. 7. Proximal popliteus tendinopathy. Electronically Signed   By: Van Clines M.D.   On: 10/10/2018 13:16      Scheduled Meds:  Chlorhexidine Gluconate Cloth  6 each Topical Q0600   enoxaparin (LOVENOX) injection  40 mg Subcutaneous Q24H   insulin aspart  0-15 Units Subcutaneous TID WC   insulin aspart  0-5 Units Subcutaneous QHS   insulin aspart  8 Units Subcutaneous TID WC   insulin glargine  30 Units Subcutaneous Daily   insulin starter kit- pen needles  1 kit Other Once   lisinopril  20 mg Oral Daily   mupirocin ointment  1 application Nasal BID   Continuous Infusions:  sodium chloride 125 mL/hr at 10/10/18 1807   vancomycin 1,500 mg (10/11/18 1037)     LOS: 3 days      Time spent: 25 minutes   Dessa Phi, DO [facility] [email or web address] 10/11/2018, 12:21 PM

## 2018-10-11 NOTE — Progress Notes (Signed)
PROGRESS NOTE    Carl Baker  UXL:[id number] DOB: February 19, 1961 DOA: 10/08/2018 PCP: Patient, No Pcp Per     Brief Narrative:  Jguadalupe Opiela is a 58 y.o. male with medical history significant of HTN, HLD, and DM presenting with bowel/bladder incontinence.  Symptoms started Monday 2 1/2 weeks ago.  He completed 4 days of antibiotics without improvement.  His urine is orange, concerning for blood in the urine.  Sexually active until about a month ago, 1 male partner.  +fever, taking Tylenol XS for pain.  It burns "like fire" when he voids.  In the emergency department, CT revealed prostatitis. Blood cultures returned with MRSA.  Patient with Foley catheter in place  New events last 24 hours / Subjective: States that pain in the medial right knee has improved, able to walk around without any pain in his lower extremities.  Wondering when he can go home.  Assessment & Plan:   Principal Problem:   MRSA bacteremia Active Problems:   Acute prostatitis with hematuria   Dehydration with hyponatremia   Hypertension   Hypercholesteremia   Diabetes mellitus without complication (HCC)   Knee pain   Sepsis secondary to MRSA bacteremia -Appreciate infectious disease -Echocardiogram completed, did not reveal vegetation -Vancomycin -Repeat blood culture pending -TEE, contacted cardiology service  Acute prostatitis -Urine culture negative -Vancomycin as above -Will need outpatient follow-up with urology  Right medial knee pain -MRI right knee reveal focal myositis with microabscess, too small to drain.  ID does not feel this is source of his MRSA bacteremia.  Also showed focal peritendinitis, small joint effusion, no definite synovial enhancement to further suggest septic joint -Pain has improved per patient  Lateral meniscal tear -Patient denies any pain in this area, no issues with ambulation -May follow-up with outpatient orthopedic surgery  Uncontrolled type 2 diabetes, with  hyperglycemia -Hemoglobin A1c 12.8 -Patient states that he has not been compliant with his medications, ran out of them -Diabetic coordinator consult -Lantus, sliding scale insulin  Hyponatremia -Improving  Hypertension -Continue lisinopril  Degenerative changes lumbar and thoracic spine -Pain control   DVT prophylaxis: Lovenox Code Status: Full Family Communication: None Disposition Plan: Pending clinical improvement, clearance of bacteremia, TEE   Consultants:   Infectious disease  Procedures:   None  Antimicrobials:  Anti-infectives (From admission, onward)   Start     Dose/Rate Route Frequency Ordered Stop   10/09/18 2200  vancomycin (VANCOCIN) 1,500 mg in sodium chloride 0.9 % 500 mL IVPB     1,500 mg 250 mL/hr over 120 Minutes Intravenous Every 12 hours 10/09/18 0618     10/09/18 1500  cefTRIAXone (ROCEPHIN) 2 g in sodium chloride 0.9 % 100 mL IVPB  Status:  Discontinued     2 g 200 mL/hr over 30 Minutes Intravenous Every 24 hours 10/08/18 1512 10/09/18 1326   10/09/18 0630  vancomycin (VANCOCIN) 2,000 mg in sodium chloride 0.9 % 500 mL IVPB     2,000 mg 250 mL/hr over 120 Minutes Intravenous  Once 10/09/18 0618 10/09/18 1048   10/08/18 1415  cefTRIAXone (ROCEPHIN) 2 g in sodium chloride 0.9 % 100 mL IVPB     2 g 200 mL/hr over 30 Minutes Intravenous  Once 10/08/18 1413 10/08/18 1517       Objective: Vitals:   10/10/18 1020 10/10/18 1417 10/10/18 2119 10/11/18 0514  BP: 130/74 124/77 137/75 129/69  Pulse:  93 100 97  Resp:  '16 18 18  ' Temp:  98.7 F (37.1 C) 98.3 F (  36.8 C) 99.1 F (37.3 C)  TempSrc:      SpO2:  97% 95% 95%  Weight:      Height:        Intake/Output Summary (Last 24 hours) at 10/11/2018 1221 Last data filed at 10/11/2018 0500 Gross per 24 hour  Intake 3538.33 ml  Output 2050 ml  Net 1488.33 ml   Filed Weights   10/08/18 1135  Weight: 105.2 kg    Examination: General exam: Appears calm and comfortable  Respiratory  system: Clear to auscultation. Respiratory effort normal. Cardiovascular system: S1 & S2 heard, RRR. No JVD, murmurs, rubs, gallops or clicks. No pedal edema. Gastrointestinal system: Abdomen is nondistended, soft and nontender. No organomegaly or masses felt. Normal bowel sounds heard. Central nervous system: Alert and oriented. No focal neurological deficits. Extremities: Symmetric 5 x 5 power. Skin: No rashes, lesions or ulcers Psychiatry: Judgement and insight appear normal. Mood & affect appropriate.   Data Reviewed: I have personally reviewed following labs and imaging studies  CBC: Recent Labs  Lab 10/08/18 1057 10/09/18 0225 10/10/18 0334 10/11/18 0554  WBC 25.4* 26.2* 21.1* 16.6*  NEUTROABS 22.7*  --   --   --   HGB 14.4 12.7* 12.1* 11.5*  HCT 43.1 38.1* 35.8* 33.7*  MCV 89.2 88.6 88.2 87.1  PLT 266 296 287 035   Basic Metabolic Panel: Recent Labs  Lab 10/08/18 1057 10/09/18 0225 10/10/18 0334 10/11/18 0554  NA 128* 129* 130* 132*  K 4.3 3.9 3.5 3.7  CL 96* 97* 98 101  CO2 19* 21* 22 23  GLUCOSE 437* 280* 256* 175*  BUN '12 11 10 14  ' CREATININE 0.89 0.73 0.69 0.81  CALCIUM 8.8* 8.2* 7.8* 7.8*   GFR: Estimated Creatinine Clearance: 130.1 mL/min (by C-G formula based on SCr of 0.81 mg/dL). Liver Function Tests: Recent Labs  Lab 10/08/18 1057  AST 15  ALT 22  ALKPHOS 100  BILITOT 0.9  PROT 6.8  ALBUMIN 2.5*   No results for input(s): LIPASE, AMYLASE in the last 168 hours. No results for input(s): AMMONIA in the last 168 hours. Coagulation Profile: No results for input(s): INR, PROTIME in the last 168 hours. Cardiac Enzymes: No results for input(s): CKTOTAL, CKMB, CKMBINDEX, TROPONINI in the last 168 hours. BNP (last 3 results) No results for input(s): PROBNP in the last 8760 hours. HbA1C: Recent Labs    10/08/18 1811  HGBA1C 12.8*   CBG: Recent Labs  Lab 10/10/18 1254 10/10/18 1644 10/10/18 2118 10/11/18 0755 10/11/18 1211  GLUCAP 243*  232* 197* 168* 157*   Lipid Profile: No results for input(s): CHOL, HDL, LDLCALC, TRIG, CHOLHDL, LDLDIRECT in the last 72 hours. Thyroid Function Tests: No results for input(s): TSH, T4TOTAL, FREET4, T3FREE, THYROIDAB in the last 72 hours. Anemia Panel: No results for input(s): VITAMINB12, FOLATE, FERRITIN, TIBC, IRON, RETICCTPCT in the last 72 hours. Sepsis Labs: Recent Labs  Lab 10/08/18 1420  LATICACIDVEN 1.7    Recent Results (from the past 240 hour(s))  SARS Coronavirus 2 (CEPHEID - Performed in [facility] hospital lab), Hosp Order     Status: None   Collection Time: 10/08/18  2:20 PM   Specimen: Urine, Catheterized; Nasopharyngeal  Result Value Ref Range Status   SARS Coronavirus 2 NEGATIVE NEGATIVE Final    Comment: (NOTE) If result is NEGATIVE SARS-CoV-2 target nucleic acids are NOT DETECTED. The SARS-CoV-2 RNA is generally detectable in upper and lower  respiratory specimens during the acute phase of infection. The lowest  concentration of SARS-CoV-2 viral copies this assay can detect is 250  copies / mL. A negative result does not preclude SARS-CoV-2 infection  and should not be used as the sole basis for treatment or other  patient management decisions.  A negative result may occur with  improper specimen collection / handling, submission of specimen other  than nasopharyngeal swab, presence of viral mutation(s) within the  areas targeted by this assay, and inadequate number of viral copies  (<250 copies / mL). A negative result must be combined with clinical  observations, patient history, and epidemiological information. If result is POSITIVE SARS-CoV-2 target nucleic acids are DETECTED. The SARS-CoV-2 RNA is generally detectable in upper and lower  respiratory specimens dur ing the acute phase of infection.  Positive  results are indicative of active infection with SARS-CoV-2.  Clinical  correlation with patient history and other diagnostic information is    necessary to determine patient infection status.  Positive results do  not rule out bacterial infection or co-infection with other viruses. If result is PRESUMPTIVE POSTIVE SARS-CoV-2 nucleic acids MAY BE PRESENT.   A presumptive positive result was obtained on the submitted specimen  and confirmed on repeat testing.  While 2019 novel coronavirus  (SARS-CoV-2) nucleic acids may be present in the submitted sample  additional confirmatory testing may be necessary for epidemiological  and / or clinical management purposes  to differentiate between  SARS-CoV-2 and other Sarbecovirus currently known to infect humans.  If clinically indicated additional testing with an alternate test  methodology [phone]) is advised. The SARS-CoV-2 RNA is generally  detectable in upper and lower respiratory sp ecimens during the acute  phase of infection. The expected result is Negative. Fact Sheet for Patients:  StrictlyIdeas.no Fact Sheet for Healthcare Providers: BankingDealers.co.za This test is not yet approved or cleared by the [place] FDA and has been authorized for detection and/or diagnosis of SARS-CoV-2 by FDA under an Emergency Use Authorization (EUA).  This EUA will remain in effect (meaning this test can be used) for the duration of the COVID-19 declaration under Section 564(b)(1) of the Act, 21 U.S.C. section 360bbb-3(b)(1), unless the authorization is terminated or revoked sooner. Performed at [facility], [place] [address], [place], [place] [address]   Urine culture     Status: Abnormal   Collection Time: 10/08/18  2:20 PM   Specimen: Urine, Random  Result Value Ref Range Status   Specimen Description URINE, RANDOM  Final   Special Requests NONE  Final   Culture (A)  Final    <10,000 COLONIES/mL INSIGNIFICANT GROWTH Performed at [facility], [place] [address], [place], [place] [address]    Report Status 10/09/2018 FINAL   Final  Culture, blood (routine x 2)     Status: Abnormal   Collection Time: 10/08/18  2:54 PM   Specimen: BLOOD RIGHT HAND  Result Value Ref Range Status   Specimen Description BLOOD RIGHT HAND  Final   Special Requests   Final    BOTTLES DRAWN AEROBIC ONLY Blood Culture results may not be optimal due to an inadequate volume of blood received in culture bottles   Culture  Setup Time   Final    AEROBIC BOTTLE ONLY GRAM POSITIVE COCCI CRITICAL VALUE NOTED.  VALUE IS CONSISTENT WITH PREVIOUSLY REPORTED AND CALLED VALUE.    Culture (A)  Final    STAPHYLOCOCCUS AUREUS SUSCEPTIBILITIES PERFORMED ON PREVIOUS CULTURE WITHIN THE LAST 5 DAYS. Performed at [facility], [address] [place], [place] [address]    Report Status 10/11/2018 FINAL  Final  Culture, blood (routine x 2)     Status: Abnormal   Collection Time: 10/08/18  2:55 PM   Specimen: BLOOD  Result Value Ref Range Status   Specimen Description BLOOD ARM  Final   Special Requests   Final    BOTTLES DRAWN AEROBIC AND ANAEROBIC Blood Culture adequate volume   Culture  Setup Time   Final    IN BOTH AEROBIC AND ANAEROBIC BOTTLES GRAM POSITIVE COCCI CRITICAL RESULT CALLED TO, READ BACK BY AND VERIFIED WITHLenna [facility] 10/09/18 0973 JDW Performed at [facility], [place] [address] [place], [place] [address]    Culture STAPHYLOCOCCUS AUREUS (A)  Final   Report Status 10/11/2018 FINAL  Final   Organism ID, Bacteria STAPHYLOCOCCUS AUREUS  Final      Susceptibility   Staphylococcus aureus - MIC*    CIPROFLOXACIN >=8 RESISTANT Resistant     ERYTHROMYCIN >=8 RESISTANT Resistant     GENTAMICIN <=0.5 SENSITIVE Sensitive     OXACILLIN >=4 RESISTANT Resistant     TETRACYCLINE <=1 SENSITIVE Sensitive     VANCOMYCIN 1 SENSITIVE Sensitive     TRIMETH/SULFA <=10 SENSITIVE Sensitive     CLINDAMYCIN <=0.25 SENSITIVE Sensitive     RIFAMPIN <=0.5 SENSITIVE Sensitive     Inducible Clindamycin NEGATIVE Sensitive     *  STAPHYLOCOCCUS AUREUS  Blood Culture ID Panel (Reflexed)     Status: Abnormal   Collection Time: 10/08/18  2:55 PM  Result Value Ref Range Status   Enterococcus species NOT DETECTED NOT DETECTED Final   Listeria monocytogenes NOT DETECTED NOT DETECTED Final   Staphylococcus species DETECTED (A) NOT DETECTED Final    Comment: CRITICAL RESULT CALLED TO, READ BACK BY AND VERIFIED WITH: J LEDFORD [facility] 10/09/18 2426 JDW    Staphylococcus aureus (BCID) DETECTED (A) NOT DETECTED Final    Comment: Methicillin (oxacillin)-resistant Staphylococcus aureus (MRSA). MRSA is predictably resistant to beta-lactam antibiotics (except ceftaroline). Preferred therapy is vancomycin unless clinically contraindicated. Patient requires contact precautions if  hospitalized. CRITICAL RESULT CALLED TO, READ BACK BY AND VERIFIED WITH: J LEDFORD [facility] 10/09/18 8341 JDW    Methicillin resistance DETECTED (A) NOT DETECTED Final    Comment: CRITICAL RESULT CALLED TO, READ BACK BY AND VERIFIED WITH: J LEDFORD [facility] 10/09/18 9622 JDW    Streptococcus species NOT DETECTED NOT DETECTED Final   Streptococcus agalactiae NOT DETECTED NOT DETECTED Final   Streptococcus pneumoniae NOT DETECTED NOT DETECTED Final   Streptococcus pyogenes NOT DETECTED NOT DETECTED Final   Acinetobacter baumannii NOT DETECTED NOT DETECTED Final   Enterobacteriaceae species NOT DETECTED NOT DETECTED Final   Enterobacter cloacae complex NOT DETECTED NOT DETECTED Final   Escherichia coli NOT DETECTED NOT DETECTED Final   Klebsiella oxytoca NOT DETECTED NOT DETECTED Final   Klebsiella pneumoniae NOT DETECTED NOT DETECTED Final   Proteus species NOT DETECTED NOT DETECTED Final   Serratia marcescens NOT DETECTED NOT DETECTED Final   Haemophilus influenzae NOT DETECTED NOT DETECTED Final   Neisseria meningitidis NOT DETECTED NOT DETECTED Final   Pseudomonas aeruginosa NOT DETECTED NOT DETECTED Final   Candida albicans NOT DETECTED NOT DETECTED  Final   Candida glabrata NOT DETECTED NOT DETECTED Final   Candida krusei NOT DETECTED NOT DETECTED Final   Candida parapsilosis NOT DETECTED NOT DETECTED Final   Candida tropicalis NOT DETECTED NOT DETECTED Final    Comment: Performed at [facility], [place] [address], [place], [place] [address]  Culture, blood (Routine X 2) w Reflex to ID Panel     Status: None (Preliminary result)   Collection Time: 10/10/18  2:15 PM   Specimen: BLOOD  Result Value Ref Range Status   Specimen Description BLOOD RIGHT ANTECUBITAL  Final   Special Requests AEROBIC BOTTLE ONLY Blood Culture adequate volume  Final   Culture   Final    NO GROWTH < 24 HOURS Performed at [facility], [place] [address] [place], [place] [address]    Report Status PENDING  Incomplete  Culture, blood (Routine X 2) w Reflex to ID Panel     Status: None (Preliminary result)   Collection Time: 10/10/18  2:15 PM   Specimen: BLOOD  Result Value Ref Range Status   Specimen Description BLOOD RIGHT ANTECUBITAL  Final   Special Requests AEROBIC BOTTLE ONLY Blood Culture adequate volume  Final   Culture   Final    NO GROWTH < 24 HOURS Performed at [facility], [place] [address], [place], [place] [address]    Report Status PENDING  Incomplete  MRSA PCR Screening     Status: Abnormal   Collection Time: 10/11/18  9:13 AM   Specimen: Nasopharyngeal  Result Value Ref Range Status   MRSA by PCR POSITIVE (A) NEGATIVE Final    Comment:        The GeneXpert MRSA Assay (FDA approved for NASAL specimens only), is one component of a comprehensive MRSA colonization surveillance program. It is not intended to diagnose MRSA infection nor to guide or monitor treatment for MRSA infections. RESULT CALLED TO, READ BACK BY AND VERIFIED WITH: Burna Mortimer RN 11:05 10/11/18 ([email or web address]) Performed at [facility], [place] [address] [place], [place] [address]       Radiology Studies: Mr Knee Right W Wo Contrast  Result Date:  10/10/2018 CLINICAL DATA:  Medial knee pain.  Bacteremia. EXAM: MRI OF THE RIGHT KNEE WITHOUT AND WITH CONTRAST TECHNIQUE: Multiplanar, multisequence MR imaging of the knee was performed before and after the administration of intravenous contrast. CONTRAST:  10 cc Gadavist COMPARISON:  None. FINDINGS: MENISCI Medial meniscus: Grade 1 signal in the posterior horn and midbody without definite extension to a meniscal surface. Lateral meniscus: Grade 3 oblique tear of the posterior horn extends to the inferior meniscal surface as on image 15/6. LIGAMENTS Cruciates:  Unremarkable Collaterals:  Proximal popliteus tendinopathy. CARTILAGE Patellofemoral:  Unremarkable Medial: Moderate degenerative chondral thinning. Mild chondral surface irregularity posteriorly along the medial femoral condyle. Lateral:  Mild to moderate degenerative chondral thinning. Joint: Small knee joint effusion. No definite synovial enhancement to suggest septic joint, although there is some accentuated T1 signal in the joint fluid which could indicate mild complexity. Popliteal Fossa: Mild focal edema and enhancement along a portion of the semitendinosus tendon on image 5/10. Extensor Mechanism: Abnormal T2 signal and enhancement posteriorly in the vastus medialis muscle inferiorly with adjacent subcutaneous edema and enhancement, and possible small microabscess formation measuring about 1.6 by 0.4 by 0.7 cm (volume = 0.2 cm^3) Mild prepatellar subcutaneous edema. Bones: No significant extra-articular osseous abnormalities identified. Other: No supplemental non-categorized findings. IMPRESSION: 1. Focal myositis potentially with small microabscesses posteriorly in the distal vastus medialis muscle. The area of incipient abscess formation is too small to drain, only about 0.2 cubic cm at this time. 2. Focal peritendinitis around a portion of the semitendinosus tendon, image 5/10. 3. Small but potentially complex knee  joint effusion. No definite  synovial enhancement in the knee joint to further suggest septic joint. 4. Grade 3 oblique tear of the posterior horn lateral meniscus extending to the inferior meniscal surface. 5. Mild to moderate degrees of degenerative chondral thinning in the medial and lateral compartments. 6. Prepatellar subcutaneous edema. 7. Proximal popliteus tendinopathy. Electronically Signed   By: Van Clines M.D.   On: 10/10/2018 13:16      Scheduled Meds:  Chlorhexidine Gluconate Cloth  6 each Topical Q0600   enoxaparin (LOVENOX) injection  40 mg Subcutaneous Q24H   insulin aspart  0-15 Units Subcutaneous TID WC   insulin aspart  0-5 Units Subcutaneous QHS   insulin aspart  8 Units Subcutaneous TID WC   insulin glargine  30 Units Subcutaneous Daily   insulin starter kit- pen needles  1 kit Other Once   lisinopril  20 mg Oral Daily   mupirocin ointment  1 application Nasal BID   Continuous Infusions:  sodium chloride 125 mL/hr at 10/10/18 1807   vancomycin 1,500 mg (10/11/18 1037)     LOS: 3 days      Time spent: 25 minutes   Dessa Phi, DO [facility] [email or web address] 10/11/2018, 12:21 PM

## 2018-10-12 ENCOUNTER — Encounter (HOSPITAL_COMMUNITY): Payer: Self-pay | Admitting: *Deleted

## 2018-10-12 ENCOUNTER — Encounter (HOSPITAL_COMMUNITY)
Admission: EM | Disposition: A | Discharge status: Home Health | Payer: Self-pay | Source: Home / Self Care | Attending: Internal Medicine

## 2018-10-12 ENCOUNTER — Inpatient Hospital Stay (HOSPITAL_COMMUNITY): Payer: Self-pay

## 2018-10-12 DIAGNOSIS — M545 Low back pain: Secondary | ICD-10-CM

## 2018-10-12 DIAGNOSIS — R7881 Bacteremia: Secondary | ICD-10-CM

## 2018-10-12 HISTORY — PX: TEE WITHOUT CARDIOVERSION: SHX5443

## 2018-10-12 HISTORY — PX: BUBBLE STUDY: SHX6837

## 2018-10-12 LAB — BASIC METABOLIC PANEL
Anion gap: 7 (ref 5–15)
BUN: 10 mg/dL (ref 6–20)
CO2: 24 mmol/L (ref 22–32)
Calcium: 8.2 mg/dL — ABNORMAL LOW (ref 8.9–10.3)
Chloride: 105 mmol/L (ref 98–111)
Creatinine, Ser: 0.85 mg/dL (ref 0.61–1.24)
Glucose, Bld: 134 mg/dL — ABNORMAL HIGH (ref 70–99)
Potassium: 4.3 mmol/L (ref 3.5–5.1)
Sodium: 136 mmol/L (ref 135–145)

## 2018-10-12 LAB — CBC
HCT: 35 % — ABNORMAL LOW (ref 39.0–52.0)
Hemoglobin: 11.9 g/dL — ABNORMAL LOW (ref 13.0–17.0)
MCH: 29.8 pg (ref 26.0–34.0)
MCHC: 34 g/dL (ref 30.0–36.0)
MCV: 87.7 fL (ref 80.0–100.0)
Platelets: 324 10*3/uL (ref 150–400)
RBC: 3.99 MIL/uL — ABNORMAL LOW (ref 4.22–5.81)
RDW: 12.5 % (ref 11.5–15.5)
WBC: 16.3 10*3/uL — ABNORMAL HIGH (ref 4.0–10.5)
nRBC: 0 % (ref 0.0–0.2)

## 2018-10-12 LAB — GLUCOSE, CAPILLARY
Glucose-Capillary: 125 mg/dL — ABNORMAL HIGH (ref 70–99)
Glucose-Capillary: 133 mg/dL — ABNORMAL HIGH (ref 70–99)
Glucose-Capillary: 126 mg/dL — ABNORMAL HIGH (ref 70–99)
Glucose-Capillary: 130 mg/dL — ABNORMAL HIGH (ref 70–99)
Glucose-Capillary: 159 mg/dL — ABNORMAL HIGH (ref 70–99)
Glucose-Capillary: 211 mg/dL — ABNORMAL HIGH (ref 70–99)

## 2018-10-12 SURGERY — ECHOCARDIOGRAM, TRANSESOPHAGEAL
Anesthesia: Moderate Sedation

## 2018-10-12 MED ORDER — LISINOPRIL 40 MG PO TABS
40.0000 mg | ORAL_TABLET | Freq: Every day | ORAL | Status: DC
  Administered 2018-10-13: 40 mg via ORAL
  Filled 2018-10-12: qty 1

## 2018-10-12 MED ORDER — DIPHENHYDRAMINE HCL 50 MG/ML IJ SOLN
INTRAMUSCULAR | Status: AC
  Filled 2018-10-12: qty 1

## 2018-10-12 MED ORDER — SODIUM CHLORIDE 0.9 % IV SOLN: INTRAVENOUS | Status: DC

## 2018-10-12 MED ORDER — FENTANYL CITRATE (PF) 100 MCG/2ML IJ SOLN
INTRAMUSCULAR | Status: DC | PRN
  Administered 2018-10-12 (×4): 25 ug via INTRAVENOUS

## 2018-10-12 MED ORDER — BUTAMBEN-TETRACAINE-BENZOCAINE 2-2-14 % EX AERO
INHALATION_SPRAY | CUTANEOUS | Status: DC | PRN
  Administered 2018-10-12: 2 via TOPICAL

## 2018-10-12 MED ORDER — FENTANYL CITRATE (PF) 100 MCG/2ML IJ SOLN
INTRAMUSCULAR | Status: AC
  Filled 2018-10-12: qty 2

## 2018-10-12 MED ORDER — SODIUM CHLORIDE BACTERIOSTATIC 0.9 % IJ SOLN
INTRAMUSCULAR | Status: DC | PRN
  Administered 2018-10-12: 9 mL via INTRAVENOUS

## 2018-10-12 MED ORDER — MUSCLE RUB 10-15 % EX CREA
1.0000 "application " | TOPICAL_CREAM | CUTANEOUS | Status: DC | PRN
  Administered 2018-10-13: 1 via TOPICAL
  Filled 2018-10-12 (×2): qty 85

## 2018-10-12 MED ORDER — MIDAZOLAM HCL (PF) 10 MG/2ML IJ SOLN
INTRAMUSCULAR | Status: DC | PRN
  Administered 2018-10-12 (×5): 2 mg via INTRAVENOUS

## 2018-10-12 MED ORDER — MIDAZOLAM HCL (PF) 5 MG/ML IJ SOLN
INTRAMUSCULAR | Status: AC
  Filled 2018-10-12: qty 2

## 2018-10-12 MED ORDER — HYDRALAZINE HCL 20 MG/ML IJ SOLN: 5.0000 mg | INTRAMUSCULAR | Status: DC | PRN

## 2018-10-12 NOTE — Interval H&P Note (Signed)
History and Physical Interval Note:  10/12/2018 9:47 AM  Carl Baker  has presented today for surgery, with the diagnosis of BACTEREMIA.  The various methods of treatment have been discussed with the patient and family. After consideration of risks, benefits and other options for treatment, the patient has consented to  Procedure(s): TRANSESOPHAGEAL ECHOCARDIOGRAM (TEE) (N/A) as a surgical intervention.  The patient's history has been reviewed, patient examined, no change in status, stable for surgery.  I have reviewed the patient's chart and labs.  Questions were answered to the patient's satisfaction.     Elouise Munroe

## 2018-10-12 NOTE — Plan of Care (Signed)

## 2018-10-12 NOTE — Progress Notes (Signed)
PROGRESS NOTE    Carl Baker  OQH:[id number] DOB: [birth year] DOA: 10/08/2018 PCP: Patient, No Pcp Per     Brief Narrative:  Carl Baker is a 58 y.o. male with medical history significant of HTN, HLD, and DM presenting with bowel/bladder incontinence.  Symptoms started Monday 2 1/2 weeks ago.  He completed 4 days of antibiotics without improvement.  His urine is orange, concerning for blood in the urine.  Sexually active until about a month ago, 1 male partner.  +fever, taking Tylenol XS for pain.  It burns "like fire" when he voids.  In the emergency department, CT revealed prostatitis. Blood cultures returned with MRSA.  Patient with Foley catheter in place  New events last 24 hours / Subjective: Underwent TEE this morning which was negative for endocarditis.  No new complaints or worsening pain today  Assessment & Plan:   Principal Problem:   MRSA bacteremia Active Problems:   Acute prostatitis with hematuria   Dehydration with hyponatremia   Hypertension   Hypercholesteremia   Diabetes mellitus without complication (HCC)   Knee pain   Sepsis secondary to MRSA bacteremia -Appreciate infectious disease -Echocardiogram completed, did not reveal vegetation -TEE negative for endocarditis -Vancomycin for 4 weeks, plan for PICC line placement tomorrow pending clearance of bacteremia  Acute prostatitis -Urine culture negative -Vancomycin as above  -Will need outpatient follow-up with urology  Right medial knee pain -MRI right knee reveal focal myositis with microabscess, too small to drain.  ID does not feel this is source of his MRSA bacteremia.  Also showed focal peritendinitis, small joint effusion, no definite synovial enhancement to further suggest septic joint -Pain has improved per patient  Lateral meniscal tear -Patient denies any pain in this area, no issues with ambulation -May follow-up with outpatient orthopedic surgery  Uncontrolled type 2 diabetes, with  hyperglycemia -Hemoglobin A1c 12.8 -Patient states that he has not been compliant with his medications, ran out of them -Diabetic coordinator consult -Lantus, sliding scale insulin  Hyponatremia -Resolved  Hypertension -Continue lisinopril  Degenerative changes lumbar and thoracic spine -Pain control   DVT prophylaxis: Lovenox Code Status: Full Family Communication: None Disposition Plan: Pending clinical improvement, clearance of bacteremia, home with home health for IV vancomycin   Consultants:   Infectious disease  Procedures:   TEE 7/21  Antimicrobials:  Anti-infectives (From admission, onward)   Start     Dose/Rate Route Frequency Ordered Stop   10/11/18 2200  vancomycin (VANCOCIN) 1,750 mg in sodium chloride 0.9 % 500 mL IVPB     1,750 mg 250 mL/hr over 120 Minutes Intravenous Every 12 hours 10/11/18 1438     10/09/18 2200  vancomycin (VANCOCIN) 1,500 mg in sodium chloride 0.9 % 500 mL IVPB  Status:  Discontinued     1,500 mg 250 mL/hr over 120 Minutes Intravenous Every 12 hours 10/09/18 0618 10/11/18 1438   10/09/18 1500  cefTRIAXone (ROCEPHIN) 2 g in sodium chloride 0.9 % 100 mL IVPB  Status:  Discontinued     2 g 200 mL/hr over 30 Minutes Intravenous Every 24 hours 10/08/18 1512 10/09/18 1326   10/09/18 0630  vancomycin (VANCOCIN) 2,000 mg in sodium chloride 0.9 % 500 mL IVPB     2,000 mg 250 mL/hr over 120 Minutes Intravenous  Once 10/09/18 0618 10/09/18 1048   10/08/18 1415  cefTRIAXone (ROCEPHIN) 2 g in sodium chloride 0.9 % 100 mL IVPB     2 g 200 mL/hr over 30 Minutes Intravenous  Once 10/08/18 1413 10/08/18  1517       Objective: Vitals:   10/12/18 1040 10/12/18 1050 10/12/18 1100 10/12/18 1154  BP: (!) 174/92 (!) 163/84 (!) 166/89 (!) 165/94  Pulse: 77 76 77 70  Resp: 13 (!) _0 Temp: 97.7 F (36.5 C)   98.1 F (36.7 C)  TempSrc: Oral   Oral  SpO2: 93% 90% 90% 98%  Weight:      Height:        Intake/Output Summary (Last 24 hours)  at 10/12/2018 1421 Last data filed at 10/12/2018 1327 Gross per 24 hour  Intake 867.8 ml  Output 900 ml  Net -32.2 ml   Filed Weights   10/08/18 1135 10/12/18 0832  Weight: 105.2 kg 105.2 kg    Examination: General exam: Appears calm and comfortable  Respiratory system: Clear to auscultation. Respiratory effort normal. Cardiovascular system: S1 & S2 heard, RRR. No JVD, murmurs, rubs, gallops or clicks. No pedal edema. Gastrointestinal system: Abdomen is nondistended, soft and nontender. No organomegaly or masses felt. Normal bowel sounds heard. Central nervous system: Alert and oriented. No focal neurological deficits. Extremities: Symmetric 5 x 5 power. Skin: No rashes, lesions or ulcers Psychiatry: Judgement and insight appear normal. Mood & affect appropriate.   Data Reviewed: I have personally reviewed following labs and imaging studies  CBC: Recent Labs  Lab 10/08/18 1057 10/09/18 0225 10/10/18 0334 10/11/18 0554 10/12/18 0457  WBC 25.4* 26.2* 21.1* 16.6* 16.3*  NEUTROABS 22.7*  --   --   --   --   HGB 14.4 12.7* 12.1* 11.5* 11.9*  HCT 43.1 38.1* 35.8* 33.7* 35.0*  MCV 89.2 88.6 88.2 87.1 87.7  PLT 266 296 287 298 681   Basic Metabolic Panel: Recent Labs  Lab 10/08/18 1057 10/09/18 0225 10/10/18 0334 10/11/18 0554 10/12/18 0457  NA 128* 129* 130* 132* 136  K 4.3 3.9 3.5 3.7 4.3  CL 96* 97* 98 101 105  CO2 19* 21* _1 GLUCOSE 437* 280* 256* 175* 134*  BUN _2 CREATININE 0.89 0.73 0.69 0.81 0.85  CALCIUM 8.8* 8.2* 7.8* 7.8* 8.2*   GFR: Estimated Creatinine Clearance: 124 mL/min (by C-G formula based on SCr of 0.85 mg/dL). Liver Function Tests: Recent Labs  Lab 10/08/18 1057  AST 15  ALT 22  ALKPHOS 100  BILITOT 0.9  PROT 6.8  ALBUMIN 2.5*   No results for input(s): LIPASE, AMYLASE in the last 168 hours. No results for input(s): AMMONIA in the last 168 hours. Coagulation Profile: No results for input(s): INR, PROTIME in the  last 168 hours. Cardiac Enzymes: No results for input(s): CKTOTAL, CKMB, CKMBINDEX, TROPONINI in the last 168 hours. BNP (last 3 results) No results for input(s): PROBNP in the last 8760 hours. HbA1C: No results for input(s): HGBA1C in the last 72 hours. CBG: Recent Labs  Lab 10/11/18 2122 10/12/18 0431 10/12/18 0748 10/12/18 1114 10/12/18 1149  GLUCAP 149* 125* 133* 126* 130*   Lipid Profile: No results for input(s): CHOL, HDL, LDLCALC, TRIG, CHOLHDL, LDLDIRECT in the last 72 hours. Thyroid Function Tests: No results for input(s): TSH, T4TOTAL, FREET4, T3FREE, THYROIDAB in the last 72 hours. Anemia Panel: No results for input(s): VITAMINB12, FOLATE, FERRITIN, TIBC, IRON, RETICCTPCT in the last 72 hours. Sepsis Labs: Recent Labs  Lab 10/08/18 1420  LATICACIDVEN 1.7    Recent Results (from the past 240 hour(s))  SARS Coronavirus 2 (CEPHEID - Performed in [facility] hospital lab), [facility]  Status: None   Collection Time: 10/08/18  2:20 PM   Specimen: Urine, Catheterized; Nasopharyngeal  Result Value Ref Range Status   SARS Coronavirus 2 NEGATIVE NEGATIVE Final    Comment: (NOTE) If result is NEGATIVE SARS-CoV-2 target nucleic acids are NOT DETECTED. The SARS-CoV-2 RNA is generally detectable in upper and lower  respiratory specimens during the acute phase of infection. The lowest  concentration of SARS-CoV-2 viral copies this assay can detect is 250  copies / mL. A negative result does not preclude SARS-CoV-2 infection  and should not be used as the sole basis for treatment or other  patient management decisions.  A negative result may occur with  improper specimen collection / handling, submission of specimen other  than nasopharyngeal swab, presence of viral mutation(s) within the  areas targeted by this assay, and inadequate number of viral copies  (<250 copies / mL). A negative result must be combined with clinical  observations, patient history, and  epidemiological information. If result is POSITIVE SARS-CoV-2 target nucleic acids are DETECTED. The SARS-CoV-2 RNA is generally detectable in upper and lower  respiratory specimens dur ing the acute phase of infection.  Positive  results are indicative of active infection with SARS-CoV-2.  Clinical  correlation with patient history and other diagnostic information is  necessary to determine patient infection status.  Positive results do  not rule out bacterial infection or co-infection with other viruses. If result is PRESUMPTIVE POSTIVE SARS-CoV-2 nucleic acids MAY BE PRESENT.   A presumptive positive result was obtained on the submitted specimen  and confirmed on repeat testing.  While 2019 novel coronavirus  (SARS-CoV-2) nucleic acids may be present in the submitted sample  additional confirmatory testing may be necessary for epidemiological  and / or clinical management purposes  to differentiate between  SARS-CoV-2 and other Sarbecovirus currently known to infect humans.  If clinically indicated additional testing with an alternate test  methodology [phone]) is advised. The SARS-CoV-2 RNA is generally  detectable in upper and lower respiratory sp ecimens during the acute  phase of infection. The expected result is Negative. Fact Sheet for Patients:  StrictlyIdeas.no Fact Sheet for Healthcare Providers: BankingDealers.co.za This test is not yet approved or cleared by the [place] FDA and has been authorized for detection and/or diagnosis of SARS-CoV-2 by FDA under an Emergency Use Authorization (EUA).  This EUA will remain in effect (meaning this test can be used) for the duration of the COVID-19 declaration under Section 564(b)(1) of the Act, 21 U.S.C. section 360bbb-3(b)(1), unless the authorization is terminated or revoked sooner. Performed at [facility], [place] [address], [place], [place] [address]   Urine culture      Status: Abnormal   Collection Time: 10/08/18  2:20 PM   Specimen: Urine, Random  Result Value Ref Range Status   Specimen Description URINE, RANDOM  Final   Special Requests NONE  Final   Culture (A)  Final    <10,000 COLONIES/mL INSIGNIFICANT GROWTH Performed at [facility], [place] [address], [place], [place] [address]    Report Status 10/09/2018 FINAL  Final  Culture, blood (routine x 2)     Status: Abnormal   Collection Time: 10/08/18  2:54 PM   Specimen: BLOOD RIGHT HAND  Result Value Ref Range Status   Specimen Description BLOOD RIGHT HAND  Final   Special Requests   Final    BOTTLES DRAWN AEROBIC ONLY Blood Culture results may not be optimal due to an inadequate  volume of blood received in culture bottles   Culture  Setup Time   Final    AEROBIC BOTTLE ONLY GRAM POSITIVE COCCI CRITICAL VALUE NOTED.  VALUE IS CONSISTENT WITH PREVIOUSLY REPORTED AND CALLED VALUE.    Culture (A)  Final    STAPHYLOCOCCUS AUREUS SUSCEPTIBILITIES PERFORMED ON PREVIOUS CULTURE WITHIN THE LAST 5 DAYS. Performed at [facility], [place] [address], [place],  [address]    Report Status 10/11/2018 FINAL  Final  Culture, blood (routine x 2)     Status: Abnormal   Collection Time: 10/08/18  2:55 PM   Specimen: BLOOD  Result Value Ref Range Status   Specimen Description BLOOD ARM  Final   Special Requests   Final    BOTTLES DRAWN AEROBIC AND ANAEROBIC Blood Culture adequate volume   Culture  Setup Time   Final    IN BOTH AEROBIC AND ANAEROBIC BOTTLES GRAM POSITIVE COCCI CRITICAL RESULT CALLED TO, READ BACK BY AND VERIFIED WITHLenna Sciara [facility]y [facility] 10/09/18 5027 JDW Performed at [facility], [place] [address] [place],  [address]    Culture STAPHYLOCOCCUS AUREUS (A)  Final   Report Status 10/11/2018 FINAL  Final   Organism ID, Bacteria STAPHYLOCOCCUS AUREUS  Final      Susceptibility   Staphylococcus aureus - MIC*    CIPROFLOXACIN >=8 RESISTANT Resistant      ERYTHROMYCIN >=8 RESISTANT Resistant     GENTAMICIN <=0.5 SENSITIVE Sensitive     OXACILLIN >=4 RESISTANT Resistant     TETRACYCLINE <=1 SENSITIVE Sensitive     VANCOMYCIN 1 SENSITIVE Sensitive     TRIMETH/SULFA <=10 SENSITIVE Sensitive     CLINDAMYCIN <=0.25 SENSITIVE Sensitive     RIFAMPIN <=0.5 SENSITIVE Sensitive     Inducible Clindamycin NEGATIVE Sensitive     * STAPHYLOCOCCUS AUREUS  Blood Culture ID Panel (Reflexed)     Status: Abnormal   Collection Time: 10/08/18  2:55 PM  Result Value Ref Range Status   Enterococcus species NOT DETECTED NOT DETECTED Final   Listeria monocytogenes NOT DETECTED NOT DETECTED Final   Staphylococcus species DETECTED (A) NOT DETECTED Final    Comment: CRITICAL RESULT CALLED TO, READ BACK BY AND VERIFIED WITH: J LEDFORD [facility] 10/09/18 7867 JDW    Staphylococcus aureus (BCID) DETECTED (A) NOT DETECTED Final    Comment: Methicillin (oxacillin)-resistant Staphylococcus aureus (MRSA). MRSA is predictably resistant to beta-lactam antibiotics (except ceftaroline). Preferred therapy is vancomycin unless clinically contraindicated. Patient requires contact precautions if  hospitalized. CRITICAL RESULT CALLED TO, READ BACK BY AND VERIFIED WITH: J LEDFORD [facility] 10/09/18 6720 JDW    Methicillin resistance DETECTED (A) NOT DETECTED Final    Comment: CRITICAL RESULT CALLED TO, READ BACK BY AND VERIFIED WITH: J LEDFORD [facility] 10/09/18 9470 JDW    Streptococcus species NOT DETECTED NOT DETECTED Final   Streptococcus agalactiae NOT DETECTED NOT DETECTED Final   Streptococcus pneumoniae NOT DETECTED NOT DETECTED Final   Streptococcus pyogenes NOT DETECTED NOT DETECTED Final   Acinetobacter baumannii NOT DETECTED NOT DETECTED Final   Enterobacteriaceae species NOT DETECTED NOT DETECTED Final   Enterobacter cloacae complex NOT DETECTED NOT DETECTED Final   Escherichia coli NOT DETECTED NOT DETECTED Final   Klebsiella oxytoca NOT DETECTED NOT DETECTED Final    Klebsiella pneumoniae NOT DETECTED NOT DETECTED Final   Proteus species NOT DETECTED NOT DETECTED Final   Serratia marcescens NOT DETECTED NOT DETECTED Final   Haemophilus influenzae NOT DETECTED NOT DETECTED Final   Neisseria meningitidis  NOT DETECTED NOT DETECTED Final   Pseudomonas aeruginosa NOT DETECTED NOT DETECTED Final   Candida albicans NOT DETECTED NOT DETECTED Final   Candida glabrata NOT DETECTED NOT DETECTED Final   Candida krusei NOT DETECTED NOT DETECTED Final   Candida parapsilosis NOT DETECTED NOT DETECTED Final   Candida tropicalis NOT DETECTED NOT DETECTED Final    Comment: Performed at [facility], [place] [address] [place], [place] [address]  Culture, blood (Routine X 2) w Reflex to ID Panel     Status: None (Preliminary result)   Collection Time: 10/10/18  2:15 PM   Specimen: BLOOD  Result Value Ref Range Status   Specimen Description BLOOD RIGHT ANTECUBITAL  Final   Special Requests AEROBIC BOTTLE ONLY Blood Culture adequate volume  Final   Culture   Final    NO GROWTH 2 DAYS Performed at [facility], [place] [address] [place], [place] [address]    Report Status PENDING  Incomplete  Culture, blood (Routine X 2) w Reflex to ID Panel     Status: None (Preliminary result)   Collection Time: 10/10/18  2:15 PM   Specimen: BLOOD  Result Value Ref Range Status   Specimen Description BLOOD RIGHT ANTECUBITAL  Final   Special Requests AEROBIC BOTTLE ONLY Blood Culture adequate volume  Final   Culture   Final    NO GROWTH 2 DAYS Performed at [facility], [place] [address] [place], [place] [address]    Report Status PENDING  Incomplete  MRSA PCR Screening     Status: Abnormal   Collection Time: 10/11/18  9:13 AM   Specimen: Nasopharyngeal  Result Value Ref Range Status   MRSA by PCR POSITIVE (A) NEGATIVE Final    Comment:        The GeneXpert MRSA Assay (FDA approved for NASAL specimens only), is one component of a comprehensive MRSA colonization  surveillance program. It is not intended to diagnose MRSA infection nor to guide or monitor treatment for MRSA infections. RESULT CALLED TO, READ BACK BY AND VERIFIED WITH: Burna Mortimer RN 11:05 10/11/18 ([email or web address]) Performed at [facility], [place] [address], [place], [place] [address]       Radiology Studies: No results found.    Scheduled Meds: . Chlorhexidine Gluconate Cloth  6 each Topical Q0600  . enoxaparin (LOVENOX) injection  40 mg Subcutaneous Q24H  . insulin aspart  0-15 Units Subcutaneous TID WC  . insulin aspart  0-5 Units Subcutaneous QHS  . insulin aspart  8 Units Subcutaneous TID WC  . insulin glargine  30 Units Subcutaneous Daily  . insulin starter kit- pen needles  1 kit Other Once  . lisinopril  20 mg Oral Daily  . mupirocin ointment  1 application Nasal BID   Continuous Infusions: . vancomycin 1,750 mg (10/12/18 1258)     LOS: 4 days      Time spent: 25 minutes   Dessa Phi, DO [facility] [email or web address] 10/12/2018, 2:21 PM

## 2018-10-12 NOTE — Progress Notes (Signed)
PHARMACY CONSULT NOTE FOR:  OUTPATIENT  PARENTERAL ANTIBIOTIC THERAPY (OPAT)  Indication: MRSA bacteremia Regimen: vancomycin 1750mg  IV q12h End date: 11/07/2018  IV antibiotic discharge orders are pended. To discharging provider:  please sign these orders via discharge navigator,  Select New Orders & click on the button choice - Manage This Unsigned Work.     Thank you for allowing pharmacy to be a part of this patient's care.  Carl Baker 10/12/2018, 1:46 PM

## 2018-10-12 NOTE — Progress Notes (Signed)
Echocardiogram Echocardiogram Transesophageal has been performed.  Carl Baker 10/12/2018, 10:31 AM

## 2018-10-12 NOTE — CV Procedure (Signed)
INDICATIONS: infective endocarditis  PROCEDURE:   Informed consent was obtained prior to the procedure. The risks, benefits and alternatives for the procedure were discussed and the patient comprehended these risks.  Risks include, but are not limited to, cough, sore throat, vomiting, nausea, somnolence, esophageal and stomach trauma or perforation, bleeding, low blood pressure, aspiration, pneumonia, infection, trauma to the teeth and death.    After a procedural time-out, the oropharynx was anesthetized with 20% benzocaine spray.   During this procedure the patient was administered a total of Versed 10 mg and Fentanyl 100 mg to achieve and maintain moderate conscious sedation.  The patient's heart rate, blood pressure, and oxygen saturationweare monitored continuously during the procedure. The period of conscious sedation was 30 minutes, of which I was present face-to-face 100% of this time.  The transesophageal probe was inserted in the esophagus and stomach without difficulty and multiple views were obtained.  The patient was kept under observation until the patient left the procedure room.  The patient left the procedure room in stable condition.   Agitated microbubble saline contrast was administered.  COMPLICATIONS:    There were no immediate complications.  FINDINGS:  No evidence of endocarditis. No valvular lesions.  Trivial MR.  No PFO  RECOMMENDATIONS:    Can return to hospital room when alert.   Time Spent Directly with the Patient:  45 minutes   Elouise Munroe 10/12/2018, 10:31 AM

## 2018-10-12 NOTE — Progress Notes (Signed)
Carl Baker for Infectious Disease  Date of Admission:  10/08/2018     Total days of antibiotics 5         ASSESSMENT/PLAN  Mr. Carl Baker is a 58 year old male admitted with right knee pain and left lower back pain radiating to his penis following a mechanical fall to weeks prior to admission and found to have MRSA bacteremia. CT imaging with concern for acute prostatitis with previous treatment initiated as outpatient by PCP with ciprofloxacin without improvement. Urine cultures without significant growth. TTE without evidence of vegetation and preserved heart valve function. TEE without evidence of endocarditis or valvular dysfunction.   MRSA bacteremia - Repeat cultures remain without growth to date. Infection may be from prostatitis. Continue to monitor cultures. PICC line tomorrow pending clearance of bacteremia. Continue current dose of vancomycin. Will plan for 4 weeks of IV treatment with vancomycin from culture date if remains negative.   Prostatitis - No current urinary symptoms but does have fluctuating back pains which is improved on exam today. Continue current dose of vancomycin.  Therapeutic Drug monitoring - renal function remains stable with no evidence of nephrotoxicity. Most recent vancomycin trough of 9 and peak of 25.  Continue dosing and monitoring per pharmacy protocol.   Principal Problem:   MRSA bacteremia Active Problems:   Acute prostatitis with hematuria   Dehydration with hyponatremia   Hypertension   Hypercholesteremia   Diabetes mellitus without complication (HCC)   Knee pain   . Chlorhexidine Gluconate Cloth  6 each Topical Q0600  . enoxaparin (LOVENOX) injection  40 mg Subcutaneous Q24H  . insulin aspart  0-15 Units Subcutaneous TID WC  . insulin aspart  0-5 Units Subcutaneous QHS  . insulin aspart  8 Units Subcutaneous TID WC  . insulin glargine  30 Units Subcutaneous Daily  . insulin starter kit- pen needles  1 kit Other Once  . lisinopril   20 mg Oral Daily  . mupirocin ointment  1 application Nasal BID    SUBJECTIVE:  Afebrile overnight with stable leukocytosis with most recent WBC count of 16.3. No acute events overnight. TEE today with no evidence of endocarditis. Feeling the best he has since arriving to the hospital. Wanting foley catheter out and to go home.   No Known Allergies   Review of Systems: Review of Systems  Constitutional: Negative for chills, fever and weight loss.  Respiratory: Negative for cough, shortness of breath and wheezing.   Cardiovascular: Negative for chest pain and leg swelling.  Gastrointestinal: Negative for abdominal pain, constipation, diarrhea, nausea and vomiting.  Skin: Negative for rash.      OBJECTIVE: Vitals:   10/12/18 1030 10/12/18 1040 10/12/18 1050 10/12/18 1100  BP: (!) 172/92 (!) 174/92 (!) 163/84 (!) 166/89  Pulse: 79 77 76 77  Resp: 14 13 (!) 24 11  Temp:  97.7 F (36.5 C)    TempSrc:  Oral    SpO2: 99% 93% 90% 90%  Weight:      Height:       Body mass index is 29.79 kg/m.  Physical Exam Constitutional:      General: He is not in acute distress.    Appearance: He is well-developed.  Cardiovascular:     Rate and Rhythm: Normal rate and regular rhythm.     Heart sounds: Normal heart sounds.  Pulmonary:     Effort: Pulmonary effort is normal.     Breath sounds: Normal breath sounds.  Genitourinary:  Comments: Foley catheter present and patent.  Skin:    General: Skin is warm and dry.  Neurological:     Mental Status: He is alert and oriented to person, place, and time.  Psychiatric:        Mood and Affect: Mood normal.     Lab Results Lab Results  Component Value Date   WBC 16.3 (H) 10/12/2018   HGB 11.9 (L) 10/12/2018   HCT 35.0 (L) 10/12/2018   MCV 87.7 10/12/2018   PLT 324 10/12/2018    Lab Results  Component Value Date   CREATININE 0.85 10/12/2018   BUN 10 10/12/2018   NA 136 10/12/2018   K 4.3 10/12/2018   CL 105 10/12/2018    CO2 24 10/12/2018    Lab Results  Component Value Date   ALT 22 10/08/2018   AST 15 10/08/2018   ALKPHOS 100 10/08/2018   BILITOT 0.9 10/08/2018     Microbiology: Recent Results (from the past 240 hour(s))  SARS Coronavirus 2 (CEPHEID - Performed in [place] hospital lab), Hosp Order     Status: None   Collection Time: 10/08/18  2:20 PM   Specimen: Urine, Catheterized; Nasopharyngeal  Result Value Ref Range Status   SARS Coronavirus 2 NEGATIVE NEGATIVE Final    Comment: (NOTE) If result is NEGATIVE SARS-CoV-2 target nucleic acids are NOT DETECTED. The SARS-CoV-2 RNA is generally detectable in upper and lower  respiratory specimens during the acute phase of infection. The lowest  concentration of SARS-CoV-2 viral copies this assay can detect is 250  copies / mL. A negative result does not preclude SARS-CoV-2 infection  and should not be used as the sole basis for treatment or other  patient management decisions.  A negative result may occur with  improper specimen collection / handling, submission of specimen other  than nasopharyngeal swab, presence of viral mutation(s) within the  areas targeted by this assay, and inadequate number of viral copies  (<250 copies / mL). A negative result must be combined with clinical  observations, patient history, and epidemiological information. If result is POSITIVE SARS-CoV-2 target nucleic acids are DETECTED. The SARS-CoV-2 RNA is generally detectable in upper and lower  respiratory specimens dur ing the acute phase of infection.  Positive  results are indicative of active infection with SARS-CoV-2.  Clinical  correlation with patient history and other diagnostic information is  necessary to determine patient infection status.  Positive results do  not rule out bacterial infection or co-infection with other viruses. If result is PRESUMPTIVE POSTIVE SARS-CoV-2 nucleic acids MAY BE PRESENT.   A presumptive positive result was obtained  on the submitted specimen  and confirmed on repeat testing.  While 2019 novel coronavirus  (SARS-CoV-2) nucleic acids may be present in the submitted sample  additional confirmatory testing may be necessary for epidemiological  and / or clinical management purposes  to differentiate between  SARS-CoV-2 and other Sarbecovirus currently known to infect humans.  If clinically indicated additional testing with an alternate test  methodology [phone]) is advised. The SARS-CoV-2 RNA is generally  detectable in upper and lower respiratory sp ecimens during the acute  phase of infection. The expected result is Negative. Fact Sheet for Patients:  StrictlyIdeas.no Fact Sheet for Healthcare Providers: BankingDealers.co.za This test is not yet approved or cleared by the [place] FDA and has been authorized for detection and/or diagnosis of SARS-CoV-2 by FDA under an Emergency Use Authorization (EUA).  This EUA will remain in effect (meaning this  test can be used) for the duration of the COVID-19 declaration under Section 564(b)(1) of the Act, 21 U.S.C. section 360bbb-3(b)(1), unless the authorization is terminated or revoked sooner. Performed at [facility], [place] [address], [place], [place] [address]   Urine culture     Status: Abnormal   Collection Time: 10/08/18  2:20 PM   Specimen: Urine, Random  Result Value Ref Range Status   Specimen Description URINE, RANDOM  Final   Special Requests NONE  Final   Culture (A)  Final    <10,000 COLONIES/mL INSIGNIFICANT GROWTH Performed at [facility], [place] [address], [place], [place] [address]    Report Status 10/09/2018 FINAL  Final  Culture, blood (routine x 2)     Status: Abnormal   Collection Time: 10/08/18  2:54 PM   Specimen: BLOOD RIGHT HAND  Result Value Ref Range Status   Specimen Description BLOOD RIGHT HAND  Final   Special Requests   Final    BOTTLES DRAWN AEROBIC ONLY  Blood Culture results may not be optimal due to an inadequate volume of blood received in culture bottles   Culture  Setup Time   Final    AEROBIC BOTTLE ONLY GRAM POSITIVE COCCI CRITICAL VALUE NOTED.  VALUE IS CONSISTENT WITH PREVIOUSLY REPORTED AND CALLED VALUE.    Culture (A)  Final    STAPHYLOCOCCUS AUREUS SUSCEPTIBILITIES PERFORMED ON PREVIOUS CULTURE WITHIN THE LAST 5 DAYS. Performed at [facility], [place] [address], [place], [place] [address]    Report Status 10/11/2018 FINAL  Final  Culture, blood (routine x 2)     Status: Abnormal   Collection Time: 10/08/18  2:55 PM   Specimen: BLOOD  Result Value Ref Range Status   Specimen Description BLOOD ARM  Final   Special Requests   Final    BOTTLES DRAWN AEROBIC AND ANAEROBIC Blood Culture adequate volume   Culture  Setup Time   Final    IN BOTH AEROBIC AND ANAEROBIC BOTTLES GRAM POSITIVE COCCI CRITICAL RESULT CALLED TO, READ BACK BY AND VERIFIED WITHLenna Sciara [facility] 10/09/18 3524 JDW Performed at [facility], [place] [address] [place], [place] [address]    Culture STAPHYLOCOCCUS AUREUS (A)  Final   Report Status 10/11/2018 FINAL  Final   Organism ID, Bacteria STAPHYLOCOCCUS AUREUS  Final      Susceptibility   Staphylococcus aureus - MIC*    CIPROFLOXACIN >=8 RESISTANT Resistant     ERYTHROMYCIN >=8 RESISTANT Resistant     GENTAMICIN <=0.5 SENSITIVE Sensitive     OXACILLIN >=4 RESISTANT Resistant     TETRACYCLINE <=1 SENSITIVE Sensitive     VANCOMYCIN 1 SENSITIVE Sensitive     TRIMETH/SULFA <=10 SENSITIVE Sensitive     CLINDAMYCIN <=0.25 SENSITIVE Sensitive     RIFAMPIN <=0.5 SENSITIVE Sensitive     Inducible Clindamycin NEGATIVE Sensitive     * STAPHYLOCOCCUS AUREUS  Blood Culture ID Panel (Reflexed)     Status: Abnormal   Collection Time: 10/08/18  2:55 PM  Result Value Ref Range Status   Enterococcus species NOT DETECTED NOT DETECTED Final   Listeria monocytogenes NOT DETECTED NOT DETECTED Final    Staphylococcus species DETECTED (A) NOT DETECTED Final    Comment: CRITICAL RESULT CALLED TO, READ BACK BY AND VERIFIED WITH: J LEDFORD [facility] 10/09/18 0931 JDW    Staphylococcus aureus (BCID) DETECTED (A) NOT DETECTED Final    Comment: Methicillin (oxacillin)-resistant Staphylococcus aureus (MRSA). MRSA is predictably resistant to  beta-lactam antibiotics (except ceftaroline). Preferred therapy is vancomycin unless clinically contraindicated. Patient requires contact precautions if  hospitalized. CRITICAL RESULT CALLED TO, READ BACK BY AND VERIFIED WITH: J LEDFORD [facility] 10/09/18 4235 JDW    Methicillin resistance DETECTED (A) NOT DETECTED Final    Comment: CRITICAL RESULT CALLED TO, READ BACK BY AND VERIFIED WITH: J LEDFORD [facility] 10/09/18 3614 JDW    Streptococcus species NOT DETECTED NOT DETECTED Final   Streptococcus agalactiae NOT DETECTED NOT DETECTED Final   Streptococcus pneumoniae NOT DETECTED NOT DETECTED Final   Streptococcus pyogenes NOT DETECTED NOT DETECTED Final   Acinetobacter baumannii NOT DETECTED NOT DETECTED Final   Enterobacteriaceae species NOT DETECTED NOT DETECTED Final   Enterobacter cloacae complex NOT DETECTED NOT DETECTED Final   Escherichia coli NOT DETECTED NOT DETECTED Final   Klebsiella oxytoca NOT DETECTED NOT DETECTED Final   Klebsiella pneumoniae NOT DETECTED NOT DETECTED Final   Proteus species NOT DETECTED NOT DETECTED Final   Serratia marcescens NOT DETECTED NOT DETECTED Final   Haemophilus influenzae NOT DETECTED NOT DETECTED Final   Neisseria meningitidis NOT DETECTED NOT DETECTED Final   Pseudomonas aeruginosa NOT DETECTED NOT DETECTED Final   Candida albicans NOT DETECTED NOT DETECTED Final   Candida glabrata NOT DETECTED NOT DETECTED Final   Candida krusei NOT DETECTED NOT DETECTED Final   Candida parapsilosis NOT DETECTED NOT DETECTED Final   Candida tropicalis NOT DETECTED NOT DETECTED Final    Comment: Performed at [facility],  [place] [address], [place], [place] [address]  Culture, blood (Routine X 2) w Reflex to ID Panel     Status: None (Preliminary result)   Collection Time: 10/10/18  2:15 PM   Specimen: BLOOD  Result Value Ref Range Status   Specimen Description BLOOD RIGHT ANTECUBITAL  Final   Special Requests AEROBIC BOTTLE ONLY Blood Culture adequate volume  Final   Culture   Final    NO GROWTH 2 DAYS Performed at [facility], [place] [address] [place], [place] [address]    Report Status PENDING  Incomplete  Culture, blood (Routine X 2) w Reflex to ID Panel     Status: None (Preliminary result)   Collection Time: 10/10/18  2:15 PM   Specimen: BLOOD  Result Value Ref Range Status   Specimen Description BLOOD RIGHT ANTECUBITAL  Final   Special Requests AEROBIC BOTTLE ONLY Blood Culture adequate volume  Final   Culture   Final    NO GROWTH 2 DAYS Performed at [facility], [place] [address] [place], [place] [address]    Report Status PENDING  Incomplete  MRSA PCR Screening     Status: Abnormal   Collection Time: 10/11/18  9:13 AM   Specimen: Nasopharyngeal  Result Value Ref Range Status   MRSA by PCR POSITIVE (A) NEGATIVE Final    Comment:        The GeneXpert MRSA Assay (FDA approved for NASAL specimens only), is one component of a comprehensive MRSA colonization surveillance program. It is not intended to diagnose MRSA infection nor to guide or monitor treatment for MRSA infections. RESULT CALLED TO, READ BACK BY AND VERIFIED WITH: Burna Mortimer RN 11:05 10/11/18 ([email or web address]) Performed at [facility], [place] [address], [place], [place] [address]      Terri Piedra, [place] for [place] Pager  10/12/2018  11:42 AM

## 2018-10-12 NOTE — TOC Initial Note (Signed)
Transition of Care [facility](TOC) - Initial/Assessment Note    Patient Details  Name: Lenon OmsClarence Smoots MRN: [id number] Date of Birth: 11/03/1960  Transition of Care [facility](TOC) CM/SW Contact:    Mearl LatinNadia S Ruthanne Mcneish, LCSW Phone Number: 10/12/2018, 1:22 PM  Clinical Narrative:                 CSW spoke with patient regarding home health services for his IV antibiotics at home. CSW confirmed his address and patient's PCP is Dr[facility]aur [facility] [phone]). [facility] She will process referral and begin teaching with the patient. CSW reaching out to [facility] for home health.  Expected Discharge Plan: Home w Home Health Services Barriers to Discharge: Continued Medical Work up   Patient Goals and CMS Choice Patient states their goals for this hospitalization and ongoing recovery are:: Return home CMS Medicare.gov Compare Post Acute Care list provided to:: Patient Choice offered to / list presented to : Patient  Expected Discharge Plan and Services Expected Discharge Plan: Home w Home Health Services In-house Referral: NA Discharge Planning Services: CM Consult Post Acute Care Choice: Home Health Living arrangements for the past 2 months: Single Family Home                 DME Arranged: N/A         HH Arranged: RN          Prior Living Arrangements/Services Living arrangements for the past 2 months: Single Family Home Lives with:: Self Patient language and need for interpreter reviewed:: Yes Do you feel safe going back to the place where you live?: Yes      Need for Family Participation in Patient Care: No (Comment) Care giver support system in place?: Yes (comment)   Criminal Activity/Legal Involvement Pertinent to Current Situation/Hospitalization: No - Comment as needed  Activities of Daily Living Home Assistive Devices/Equipment: None ADL Screening (condition at time of admission) Patient's cognitive ability adequate to safely complete daily  activities?: Yes Is the patient deaf or have difficulty hearing?: No Does the patient have difficulty seeing, even when wearing glasses/contacts?: No Does the patient have difficulty concentrating, remembering, or making decisions?: No Patient able to express need for assistance with ADLs?: Yes Does the patient have difficulty dressing or bathing?: Yes Independently performs ADLs?: No Communication: Independent Dressing (OT): Appropriate for developmental age Grooming: Independent Feeding: Independent Bathing: Needs assistance Is this a change from baseline?: Change from baseline, expected to last >3 days Toileting: Needs assistance Is this a change from baseline?: Change from baseline, expected to last >3days In/Out Bed: Needs assistance Walks in Home: Independent Does the patient have difficulty walking or climbing stairs?: No( Pt reports he is independent @ home) Weakness of Legs: Both(Pt reports he is independent @ home) Weakness of Arms/Hands: None  Permission Sought/Granted Permission sought to share information with : Facility Medical sales representativeContact Representative, Case Estate manager/land agentManager Permission granted to share information with : Yes, Verbal Permission Granted              Emotional Assessment Appearance:: Appears stated age Attitude/Demeanor/Rapport: Other (comment)(Appropriate) Affect (typically observed): Accepting, Appropriate Orientation: : Oriented to Self, Oriented to Place, Oriented to  Time, Oriented to Situation Alcohol / Substance Use: Not Applicable Psych Involvement: No (comment)  Admission diagnosis:  Acute prostatitis [N41.0] Acute cystitis with hematuria [N30.01] Patient Active Problem List   Diagnosis Date Noted  . MRSA bacteremia 10/09/2018  . Knee pain 10/09/2018  . Acute prostatitis with hematuria 10/08/2018  .  Dehydration with hyponatremia 10/08/2018  . Hypertension   . Hypercholesteremia   . Diabetes mellitus without complication [facility]    PCP:  Patient, No Pcp  Per Pharmacy:   [place] ([place]), [place] - [address] [place] [address] Phone: [phone] Fax: [phone]     Social Determinants of Health (SDOH) Interventions    Readmission Risk Interventions No flowsheet data found.

## 2018-10-12 NOTE — Progress Notes (Signed)
Pt returned from procedure awake and alert. Report received from PACU. VS complete and drink provided to patient. Diet changed back to carb mod.  Foley trial will not be completed today per request of Dr. Maylene Roes. Pt still has prostate infection and she does not want the foley to be discontinued yet.  Will continue to follow as needed. Cathlean Marseilles Tamala Julian, MSN, RN, [place], AGCNS

## 2018-10-12 NOTE — Progress Notes (Signed)
Inpatient Diabetes Program Recommendations  AACE/ADA: New Consensus Statement on Inpatient Glycemic Control (2015)  Target Ranges:  Prepandial:   less than 140 mg/dL      Peak postprandial:   less than 180 mg/dL (1-2 hours)      Critically ill patients:  140 - 180 mg/dL   Lab Results  Component Value Date   GLUCAP 130 (H) 10/12/2018   HGBA1C 12.8 (H) 10/08/2018    Review of Glycemic Control  Educated patient on insulin pen use at home. Reviewed contents of insulin flexpen starter kit. Reviewed all steps if insulin pen including attachment of needle, 2-unit air shot, dialing up dose, giving injection, removing needle, disposal of sharps, storage of unused insulin, disposal of insulin etc. Patient able to provide successful return demonstration. Also reviewed troubleshooting with insulin pen. MD to give patient Rxs for insulin pens and insulin pen needles.  Blood sugars today - 125, 133, 130 mg/dL Eating 100%.  Continue to follow.  Thank you. Lorenda Peck, RD, LDN, CDE Inpatient Diabetes Coordinator [phone]

## 2018-10-13 ENCOUNTER — Inpatient Hospital Stay: Payer: Self-pay

## 2018-10-13 DIAGNOSIS — Z95828 Presence of other vascular implants and grafts: Secondary | ICD-10-CM

## 2018-10-13 LAB — GLUCOSE, CAPILLARY
Glucose-Capillary: 125 mg/dL — ABNORMAL HIGH (ref 70–99)
Glucose-Capillary: 113 mg/dL — ABNORMAL HIGH (ref 70–99)
Glucose-Capillary: 171 mg/dL — ABNORMAL HIGH (ref 70–99)

## 2018-10-13 LAB — BASIC METABOLIC PANEL
Anion gap: 8 (ref 5–15)
BUN: 9 mg/dL (ref 6–20)
CO2: 23 mmol/L (ref 22–32)
Calcium: 7.8 mg/dL — ABNORMAL LOW (ref 8.9–10.3)
Chloride: 103 mmol/L (ref 98–111)
Creatinine, Ser: 0.71 mg/dL (ref 0.61–1.24)
GFR calc Af Amer: >60 mL/min (ref >60)
GFR calc non Af Amer: >60 mL/min (ref >60)
Glucose, Bld: 141 mg/dL — ABNORMAL HIGH (ref 70–99)
Potassium: 3.6 mmol/L (ref 3.5–5.1)
Sodium: 134 mmol/L — ABNORMAL LOW (ref 135–145)

## 2018-10-13 LAB — CBC
HCT: 34.4 % — ABNORMAL LOW (ref 39.0–52.0)
Hemoglobin: 11.6 g/dL — ABNORMAL LOW (ref 13.0–17.0)
MCH: 30 pg (ref 26.0–34.0)
MCHC: 33.7 g/dL (ref 30.0–36.0)
MCV: 88.9 fL (ref 80.0–100.0)
Platelets: 347 10*3/uL (ref 150–400)
RBC: 3.87 MIL/uL — ABNORMAL LOW (ref 4.22–5.81)
RDW: 12.7 % (ref 11.5–15.5)
WBC: 14.6 10*3/uL — ABNORMAL HIGH (ref 4.0–10.5)
nRBC: 0 % (ref 0.0–0.2)

## 2018-10-13 MED ORDER — BLOOD GLUCOSE MONITOR KIT: PACK | 0 refills | Status: AC

## 2018-10-13 MED ORDER — NOVOLOG MIX 70/30 FLEXPEN (70-30) 100 UNIT/ML ~~LOC~~ SUPN: 20.0000 [IU] | PEN_INJECTOR | Freq: Two times a day (BID) | SUBCUTANEOUS | 2 refills | Status: DC

## 2018-10-13 MED ORDER — HEPARIN SOD (PORK) LOCK FLUSH 100 UNIT/ML IV SOLN
250.0000 [IU] | INTRAVENOUS | Status: AC | PRN
  Administered 2018-10-13: 250 [IU]

## 2018-10-13 MED ORDER — INSULIN PEN NEEDLE 32G X 4 MM MISC: 1.0000 | Freq: Two times a day (BID) | 1 refills | Status: AC

## 2018-10-13 MED ORDER — SODIUM CHLORIDE 0.9% FLUSH
10.0000 mL | INTRAVENOUS | Status: DC | PRN
  Administered 2018-10-13: 10 mL
  Filled 2018-10-13: qty 40

## 2018-10-13 MED ORDER — VANCOMYCIN IV (FOR PTA / DISCHARGE USE ONLY): 1750.0000 mg | Freq: Two times a day (BID) | INTRAVENOUS | 0 refills | Status: AC

## 2018-10-13 MED FILL — NOVOLOG MIX 70-30 FLEXPEN S: (70-30) 100 | 30 days supply | Qty: 12 | Fill #0

## 2018-10-13 MED FILL — PENTIPS 32G X 4 MM MISC: 32G X 4 MM | 30 days supply | Qty: 100 | Fill #0

## 2018-10-13 NOTE — Plan of Care (Signed)

## 2018-10-13 NOTE — Progress Notes (Signed)
Peripherally Inserted Central Catheter/Midline Placement  The IV Nurse has discussed with the patient and/or persons authorized to consent for the patient, the purpose of this procedure and the potential benefits and risks involved with this procedure.  The benefits include less needle sticks, lab draws from the catheter, and the patient may be discharged home with the catheter. Risks include, but not limited to, infection, bleeding, blood clot (thrombus formation), and puncture of an artery; nerve damage and irregular heartbeat and possibility to perform a PICC exchange if needed/ordered by physician.  Alternatives to this procedure were also discussed.  Bard Power PICC patient education guide, fact sheet on infection prevention and patient information card has been provided to patient /or left at bedside.    PICC/Midline Placement Documentation  PICC Single Lumen 10/13/18 PICC Right Brachial 42 cm 0 cm (Active)  Indication for Insertion or Continuance of Line Home intravenous therapies (PICC only) 10/13/18 0947  Exposed Catheter (cm) 0 cm 10/13/18 0947  Site Assessment Clean;Dry;Intact 10/13/18 0947  Line Status Flushed;Blood return noted;Saline locked 10/13/18 0947  Dressing Type Transparent;Securing device 10/13/18 0947  Dressing Status Clean;Dry;Intact;Antimicrobial disc in place 10/13/18 0947  Dressing Change Due 10/20/18 10/13/18 0947       Frances Maywood 10/13/2018, 9:51 AM

## 2018-10-13 NOTE — Progress Notes (Signed)
[facility]  Date of Admission:  10/08/2018     Total days of antibiotics 6         ASSESSMENT/PLAN  Carl Baker is a 58 year old male with right knee pain and left lower back pain radiating to his penis following a mechanical fall 2 weeks prior to admission and found to have MRSA bacteremia. CT imaging with concern for acute prostatitis which was refractory to previous outpatient treatment by PCP with ciprofloxacin prior to admission. Urine cultures without growth. TTE and TEE without evidence of endocarditis.  MRSA bacteremia - Repeat cultures have continued to remain without growth after 3 days. Source appears to be prostatitis. PICC line placed today for prolonged therapy of 4 weeks. OPAT orders placed. Will continue vancomycin through 11/07/18 and arrange clinic follow up.  Acute Prostatitis - No current urinary symptoms. Foley catheter our and awaiting ability to urinate.  Recommend follow up with urology. Continue care per primary team.   Therapeutic Drug Monitoring - Renal function remains stable with no evidence of nephrotoxicity. Continue management per pharmacy protocol with transition to [place] at discharge.   Diagnosis: Prostatitis / MRSA bacteremia   Culture Result: MRSA  No Known Allergies  OPAT Orders Discharge antibiotics: Vancomycin  Per pharmacy protocol  Aim for Vancomycin trough 15-20 (unless otherwise indicated) Duration: 4 weeks End Date: 11/07/18  [facility] Care Per Protocol:  Labs weekly while on IV antibiotics: _X_ CBC with differential _X_ BMP (twice weekly) __ CMP _X_ CRP _X_ ESR _X_ Vancomycin trough __ CK  _X_ Please pull PIC at completion of IV antibiotics __ Please leave PIC in place until doctor has seen patient or been notified  Fax weekly labs to [phone]  Clinic Follow Up Appt:  11/08/18 at 3:30 with Dr. Linus Salmons   Principal Problem:   MRSA bacteremia Active Problems:   Acute prostatitis with  hematuria   Dehydration with hyponatremia   Hypertension   Hypercholesteremia   Diabetes mellitus without complication (HCC)   Knee pain   . Chlorhexidine Gluconate Cloth  6 each Topical Q0600  . enoxaparin (LOVENOX) injection  40 mg Subcutaneous Q24H  . insulin aspart  0-15 Units Subcutaneous TID WC  . insulin aspart  0-5 Units Subcutaneous QHS  . insulin aspart  8 Units Subcutaneous TID WC  . insulin glargine  30 Units Subcutaneous Daily  . insulin starter kit- pen needles  1 kit Other Once  . lisinopril  40 mg Oral Daily  . mupirocin ointment  1 application Nasal BID    SUBJECTIVE:  Afebrile overnight with no acute events. PICC line inserted this morning. Continues to feel well. Catheter removed today. Ready to go home and appreciative of care.   No Known Allergies   Review of Systems: Review of Systems  Constitutional: Negative for chills, fever and weight loss.  Respiratory: Negative for cough, shortness of breath and wheezing.   Cardiovascular: Negative for chest pain and leg swelling.  Gastrointestinal: Negative for abdominal pain, constipation, diarrhea, nausea and vomiting.  Skin: Negative for rash.      OBJECTIVE: Vitals:   10/12/18 1100 10/12/18 1154 10/12/18 2203 10/13/18 0458  BP: (!) 166/89 (!) 165/94 (!) 156/95 122/76  Pulse: 77 70 83 84  Resp: 11 19    Temp:  98.1 F (36.7 C) 98.6 F (37 C) 98.3 F (36.8 C)  TempSrc:  Oral Oral Oral  SpO2: 90% 98% 96% 94%  Weight:  Height:       Body mass index is 29.79 kg/m.  Physical Exam Constitutional:      General: He is not in acute distress.    Appearance: He is well-developed.  Cardiovascular:     Rate and Rhythm: Normal rate and regular rhythm.     Heart sounds: Normal heart sounds.     Comments: PICC line in right upper extremity with dressing that is clean and dry. Site appears without evidence of infection.  Pulmonary:     Effort: Pulmonary effort is normal.     Breath sounds: Normal  breath sounds.  Skin:    General: Skin is warm and dry.  Neurological:     Mental Status: He is alert.  Psychiatric:        Mood and Affect: Mood normal.     Lab Results Lab Results  Component Value Date   WBC 14.6 (H) 10/13/2018   HGB 11.6 (L) 10/13/2018   HCT 34.4 (L) 10/13/2018   MCV 88.9 10/13/2018   PLT 347 10/13/2018    Lab Results  Component Value Date   CREATININE 0.71 10/13/2018   BUN 9 10/13/2018   NA 134 (L) 10/13/2018   K 3.6 10/13/2018   CL 103 10/13/2018   CO2 23 10/13/2018    Lab Results  Component Value Date   ALT 22 10/08/2018   AST 15 10/08/2018   ALKPHOS 100 10/08/2018   BILITOT 0.9 10/08/2018     Microbiology: Recent Results (from the past 240 hour(s))  SARS Coronavirus 2 (CEPHEID - Performed in [facility] hospital lab), Hosp Order     Status: None   Collection Time: 10/08/18  2:20 PM   Specimen: Urine, Catheterized; Nasopharyngeal  Result Value Ref Range Status   SARS Coronavirus 2 NEGATIVE NEGATIVE Final    Comment: (NOTE) If result is NEGATIVE SARS-CoV-2 target nucleic acids are NOT DETECTED. The SARS-CoV-2 RNA is generally detectable in upper and lower  respiratory specimens during the acute phase of infection. The lowest  concentration of SARS-CoV-2 viral copies this assay can detect is 250  copies / mL. A negative result does not preclude SARS-CoV-2 infection  and should not be used as the sole basis for treatment or other  patient management decisions.  A negative result may occur with  improper specimen collection / handling, submission of specimen other  than nasopharyngeal swab, presence of viral mutation(s) within the  areas targeted by this assay, and inadequate number of viral copies  (<250 copies / mL). A negative result must be combined with clinical  observations, patient history, and epidemiological information. If result is POSITIVE SARS-CoV-2 target nucleic acids are DETECTED. The SARS-CoV-2 RNA is generally detectable  in upper and lower  respiratory specimens dur ing the acute phase of infection.  Positive  results are indicative of active infection with SARS-CoV-2.  Clinical  correlation with patient history and other diagnostic information is  necessary to determine patient infection status.  Positive results do  not rule out bacterial infection or co-infection with other viruses. If result is PRESUMPTIVE POSTIVE SARS-CoV-2 nucleic acids MAY BE PRESENT.   A presumptive positive result was obtained on the submitted specimen  and confirmed on repeat testing.  While 2019 novel coronavirus  (SARS-CoV-2) nucleic acids may be present in the submitted sample  additional confirmatory testing may be necessary for epidemiological  and / or clinical management purposes  to differentiate between  SARS-CoV-2 and other Sarbecovirus currently known to infect humans.  If  clinically indicated additional testing with an alternate test  methodology [phone]) is advised. The SARS-CoV-2 RNA is generally  detectable in upper and lower respiratory sp ecimens during the acute  phase of infection. The expected result is Negative. Fact Sheet for Patients:  StrictlyIdeas.no Fact Sheet for Healthcare Providers: BankingDealers.co.za This test is not yet approved or cleared by the [place] FDA and has been authorized for detection and/or diagnosis of SARS-CoV-2 by FDA under an Emergency Use Authorization (EUA).  This EUA will remain in effect (meaning this test can be used) for the duration of the COVID-19 declaration under Section 564(b)(1) of the Act, 21 U.S.C. section 360bbb-3(b)(1), unless the authorization is terminated or revoked sooner. Performed at [facility], [place] [address] [place], [place] [address]   Urine culture     Status: Abnormal   Collection Time: 10/08/18  2:20 PM   Specimen: Urine, Random  Result Value Ref Range Status   Specimen Description  URINE, RANDOM  Final   Special Requests NONE  Final   Culture (A)  Final    <10,000 COLONIES/mL INSIGNIFICANT GROWTH Performed at [facility], [place] [address] [place], [place] [address]    Report Status 10/09/2018 FINAL  Final  Culture, blood (routine x 2)     Status: Abnormal   Collection Time: 10/08/18  2:54 PM   Specimen: BLOOD RIGHT HAND  Result Value Ref Range Status   Specimen Description BLOOD RIGHT HAND  Final   Special Requests   Final    BOTTLES DRAWN AEROBIC ONLY Blood Culture results may not be optimal due to an inadequate volume of blood received in culture bottles   Culture  Setup Time   Final    AEROBIC BOTTLE ONLY GRAM POSITIVE COCCI CRITICAL VALUE NOTED.  VALUE IS CONSISTENT WITH PREVIOUSLY REPORTED AND CALLED VALUE.    Culture (A)  Final    STAPHYLOCOCCUS AUREUS SUSCEPTIBILITIES PERFORMED ON PREVIOUS CULTURE WITHIN THE LAST 5 DAYS. Performed at [facility], [place] [address], [place], [place] [address]    Report Status 10/11/2018 FINAL  Final  Culture, blood (routine x 2)     Status: Abnormal   Collection Time: 10/08/18  2:55 PM   Specimen: BLOOD  Result Value Ref Range Status   Specimen Description BLOOD ARM  Final   Special Requests   Final    BOTTLES DRAWN AEROBIC AND ANAEROBIC Blood Culture adequate volume   Culture  Setup Time   Final    IN BOTH AEROBIC AND ANAEROBIC BOTTLES GRAM POSITIVE COCCI CRITICAL RESULT CALLED TO, READ BACK BY AND VERIFIED WITHLenna Sciara [facility] 10/09/18 3790 JDW Performed at [facility], [place] [address], [place],  [address]    Culture STAPHYLOCOCCUS AUREUS (A)  Final   Report Status 10/11/2018 FINAL  Final   Organism ID, Bacteria STAPHYLOCOCCUS AUREUS  Final      Susceptibility   Staphylococcus aureus - MIC*    CIPROFLOXACIN >=8 RESISTANT Resistant     ERYTHROMYCIN >=8 RESISTANT Resistant     GENTAMICIN <=0.5 SENSITIVE Sensitive     OXACILLIN >=4 RESISTANT Resistant     TETRACYCLINE <=1 SENSITIVE  Sensitive     VANCOMYCIN 1 SENSITIVE Sensitive     TRIMETH/SULFA <=10 SENSITIVE Sensitive     CLINDAMYCIN <=0.25 SENSITIVE Sensitive     RIFAMPIN <=0.5 SENSITIVE Sensitive     Inducible Clindamycin NEGATIVE Sensitive     * STAPHYLOCOCCUS AUREUS  Blood Culture ID Panel (Reflexed)  Status: Abnormal   Collection Time: 10/08/18  2:55 PM  Result Value Ref Range Status   Enterococcus species NOT DETECTED NOT DETECTED Final   Listeria monocytogenes NOT DETECTED NOT DETECTED Final   Staphylococcus species DETECTED (A) NOT DETECTED Final    Comment: CRITICAL RESULT CALLED TO, READ BACK BY AND VERIFIED WITH: J LEDFORD [facility] 10/09/18 6269 JDW    Staphylococcus aureus (BCID) DETECTED (A) NOT DETECTED Final    Comment: Methicillin (oxacillin)-resistant Staphylococcus aureus (MRSA). MRSA is predictably resistant to beta-lactam antibiotics (except ceftaroline). Preferred therapy is vancomycin unless clinically contraindicated. Patient requires contact precautions if  hospitalized. CRITICAL RESULT CALLED TO, READ BACK BY AND VERIFIED WITH: J LEDFORD [facility] 10/09/18 4854 JDW    Methicillin resistance DETECTED (A) NOT DETECTED Final    Comment: CRITICAL RESULT CALLED TO, READ BACK BY AND VERIFIED WITH: J LEDFORD [facility] 10/09/18 6270 JDW    Streptococcus species NOT DETECTED NOT DETECTED Final   Streptococcus agalactiae NOT DETECTED NOT DETECTED Final   Streptococcus pneumoniae NOT DETECTED NOT DETECTED Final   Streptococcus pyogenes NOT DETECTED NOT DETECTED Final   Acinetobacter baumannii NOT DETECTED NOT DETECTED Final   Enterobacteriaceae species NOT DETECTED NOT DETECTED Final   Enterobacter cloacae complex NOT DETECTED NOT DETECTED Final   Escherichia coli NOT DETECTED NOT DETECTED Final   Klebsiella oxytoca NOT DETECTED NOT DETECTED Final   Klebsiella pneumoniae NOT DETECTED NOT DETECTED Final   Proteus species NOT DETECTED NOT DETECTED Final   Serratia marcescens NOT DETECTED NOT DETECTED  Final   Haemophilus influenzae NOT DETECTED NOT DETECTED Final   Neisseria meningitidis NOT DETECTED NOT DETECTED Final   Pseudomonas aeruginosa NOT DETECTED NOT DETECTED Final   Candida albicans NOT DETECTED NOT DETECTED Final   Candida glabrata NOT DETECTED NOT DETECTED Final   Candida krusei NOT DETECTED NOT DETECTED Final   Candida parapsilosis NOT DETECTED NOT DETECTED Final   Candida tropicalis NOT DETECTED NOT DETECTED Final    Comment: Performed at [facility], [place] [address] [place], [place] [address]  Culture, blood (Routine X 2) w Reflex to ID Panel     Status: None (Preliminary result)   Collection Time: 10/10/18  2:15 PM   Specimen: BLOOD  Result Value Ref Range Status   Specimen Description BLOOD RIGHT ANTECUBITAL  Final   Special Requests AEROBIC BOTTLE ONLY Blood Culture adequate volume  Final   Culture   Final    NO GROWTH 2 DAYS Performed at [facility], [place] [address] [place], [place] [address]    Report Status PENDING  Incomplete  Culture, blood (Routine X 2) w Reflex to ID Panel     Status: None (Preliminary result)   Collection Time: 10/10/18  2:15 PM   Specimen: BLOOD  Result Value Ref Range Status   Specimen Description BLOOD RIGHT ANTECUBITAL  Final   Special Requests AEROBIC BOTTLE ONLY Blood Culture adequate volume  Final   Culture   Final    NO GROWTH 2 DAYS Performed at [facility], [place] [address], [place], [place] [address]    Report Status PENDING  Incomplete  MRSA PCR Screening     Status: Abnormal   Collection Time: 10/11/18  9:13 AM   Specimen: Nasopharyngeal  Result Value Ref Range Status   MRSA by PCR POSITIVE (A) NEGATIVE Final    Comment:        The GeneXpert MRSA Assay (FDA approved for NASAL specimens only), is one component of  a comprehensive MRSA colonization surveillance program. It is not intended to diagnose MRSA infection nor to guide or monitor treatment for MRSA infections. RESULT CALLED TO, READ  BACK BY AND VERIFIED WITH: Burna Mortimer RN 11:05 10/11/18 ([email or web address]) Performed at [facility], [place] [address], [place], [place] [address]      Terri Piedra, [facility] [phone] Pager  10/13/2018  10:30 AM

## 2018-10-13 NOTE — Progress Notes (Signed)
Inpatient Diabetes Program Recommendations  AACE/ADA: New Consensus Statement on Inpatient Glycemic Control  Target Ranges:  Prepandial:   less than 140 mg/dL      Peak postprandial:   less than 180 mg/dL (1-2 hours)      Critically ill patients:  140 - 180 mg/dL   Results for Carl Baker, Carl Baker (MRN [id number]) as of 10/13/2018 11:52  Ref. Range 10/12/2018 07:48 10/12/2018 11:14 10/12/2018 11:49 10/12/2018 16:14 10/12/2018 21:46 10/13/2018 04:59 10/13/2018 08:04  Glucose-Capillary Latest Ref Range: 70 - 99 mg/dL 133 (H) 126 (H) 130 (H) 159 (H) 211 (H) 125 (H) 113 (H)   Review of Glycemic Control  Diabetes history: DM2 Outpatient Diabetes medications: Actos 30 mg daily, Amaryl 4 mg daily Current orders for Inpatient glycemic control: Lantus 30 units daily, Novolog 0-15 units TID with meals, Novolog 0-5 units QHS, Novolog 8 units TID with meals   Discharge recommendations: If patient will be able to get medication filled at [facility] pharmcy at discharge, please consider ordering NOVOLOG Flexpen 70/30 20 units BID (dose will provide a total of 28 units for basal and 12 units for meal coverage per day).   NOTE: Diabetes Coordinator spoke with patient on 10/11/18 and patient was also seen by another Diabetes Coordinator on 10/12/18 and educated on insulin pens. Noted patient does not have any insurance and will need affordable insulin. Therefore, would recommend using 70/30 insulin which is more affordable. NOVOLIN 70/30 insulin can be purchased from [facility] for $25 per vial or $43 for a box of 5 insulin pens. Spoke to patient over the phone and he reports that he has no money and he can not afford $25 for vials nor $43 for pens at [facility] for Novolin insulin. Patient reports he will take either vial/syringe or insulin pens if someone can help him get the insulin. Charlesetta Ivory, CM and left message asking for return call regarding patient needs. Also called Dr. Broadus John to make her aware that patient will need  assistance with medications. Dr. Broadus John notes she will talk with CM about getting patient established with [facility]) for follow up and medication needs. Called [facility] pharmacy and was told that they have Novolog 70/30 in vials and insulin pens. If patient will be able to get medication filled at [facility], please prescribe Novolog Flexpen 70/30 20 units BID (#[id number]), insulin pen needles (#[id number]), and glucose monitoring kit (#[id number]).  Thanks, Barnie Alderman, RN, MSN, CDE Diabetes Coordinator Inpatient Diabetes Program [phone] (Team Pager from 8am to 5pm)

## 2018-10-13 NOTE — TOC Transition Note (Signed)
Transition of Care [facility]) - CM/SW Discharge Note   Patient Details  Name: Lauro Manlove MRN: [id number] Date of Birth: 08-12-1960  Transition of Care [facility]) CM/SW Contact:  Benard Halsted, LCSW Phone Number: 10/13/2018, 2:16 PM   Clinical Narrative:    Per patient, he is unable to afford his medications. RNCM provided [place] letter. CSW obtainted follow up appointment at [facility] so they can assist with his medications. [place] notified of DC. Patient able to discharge once IV antibiotic teaching has been completed.    Final next level of care: [place] Barriers to Discharge: No Barriers Identified   Patient Goals and CMS Choice Patient states their goals for this hospitalization and ongoing recovery are:: Return home CMS Medicare.gov Compare Post Acute Care list provided to:: Patient Choice offered to / list presented to : Patient  Discharge Placement                Patient to be transferred to facility by: Car   Patient and family notified of of transfer: 10/13/18  Discharge Plan and Services In-house Referral: NA Discharge Planning Services: CM Consult Post Acute Care Choice: Home Health          DME Arranged: N/A         HH Arranged: RN [place] Agency: [place] Date [facility] Agency Contacted: 10/12/18 Time [place] Agency Contacted: 1200 Representative spoke with at [place]: [place] Determinants of Health ([place]) Interventions     Readmission Risk Interventions No flowsheet data found.

## 2018-10-13 NOTE — Plan of Care (Signed)
Carl Baker to be discharged Home per MD order. Discussed prescriptions and follow up appointments with the patient. Prescriptions given to patient; medication list explained in detail. Patient verbalized understanding.   Skin clean, dry and intact without evidence of skin break down, no evidence of skin tears noted. IV catheter discontinued intact. Site without signs and symptoms of complications. Dressing and pressure applied. Pt denies pain at the site currently. No complaints noted. PICC line capped for discharge. HH will follow-up with IV ABX per order. Foley dc'd this AM without complications. Pt able to void >300cc.   An After Visit Summary (AVS) was printed and given to the patient. Patient escorted via wheelchair, and discharged home via private auto.  Cathlean Marseilles Tamala Julian, MSN, RN, [place], AGCNS

## 2018-10-13 NOTE — Discharge Summary (Signed)
Physician Discharge Summary  Izzak Fries BDZ:[id number] DOB: [birth year] DOA: 10/08/2018  PCP: Patient, No Pcp Per  Admit date: 10/08/2018 Discharge date: 10/13/2018  Time spent: 35 minutes  Recommendations for Outpatient Follow-up:  1. [facility] 8/18 at 2:30 PM 2. Urology in 1 month 3. Infectious disease Dr. Linus Salmons on 8/17 at 1:30 PM 4. Health nursing, home health pharmacy to monitor bmet weekly, vancomycin trough, please discontinue PICC line once antibiotic course is completed 5. Stop date for IV vancomycin is 8/16   Discharge Diagnoses:  Principal Problem:   MRSA bacteremia Active Problems:   Acute prostatitis with hematuria   Dehydration with hyponatremia   Hypertension   Hypercholesteremia   Diabetes mellitus without complication (HCC)   Knee pain   Discharge Condition: Stable, improved  Diet recommendation: Diabetic  Filed Weights   10/08/18 1135 10/12/18 0832  Weight: 105.2 kg 105.2 kg    History of present illness:  58 year old male with uncontrolled type 2 diabetes mellitus hypertension, dyslipidemia presented to the ED with urinary retention and intermittent incontinence, fever severe dysuria.  Hospital Course:   Sepsis secondary to MRSA bacteremia -Likely secondary to prostatitis, infectious disease was consulted, he was treated with IV vancomycin with good effect, 2D echocardiogram and TEE were negative for endocarditis -repeat blood cultures were negative for over 48 hours  -PICC line was placed today with a plan to continue IV vancomycin for total of 4 weeks, stop date is 8/16  -Discharged home with the PICC line with home health nursing assistance, infectious disease follow-up arranged for 8/17, importance of compliance emphasized  Acute prostatitis -Urine culture negative -Vancomycin as above, Foley catheter removed, patient was able to urinate twice post catheter removal -Will need outpatient follow-up with urology in 1  month  Right medial knee pain -MRI right knee reveal focal myositis with microabscess, too small to drain.  ID does not feel this is source of his MRSA bacteremia.  Also showed focal peritendinitis, small joint effusion, no definite synovial enhancement to further suggest septic joint -Pain has improved per patient  Lateral meniscal tear -Patient denies any pain in this area, no issues with ambulation -May follow-up with outpatient orthopedic surgery  Uncontrolled type 2 diabetes, with hyperglycemia -Hemoglobin A1c 12.8 -Patient states that he has not been compliant with his medications, given the severity of his hyperglycemia and A1c of 12.8 after much discussion he was started on insulin with recommendations to continue this as outpatient, due to cost and uninsured status, started on insulin 70/30,  completed insulin teaching was seen by diabetic coordinator multiple times, also seen by case management, his prescriptions were [facility] to [facility] and will subsequently get further refills from [facility] wellness clinic. -[facility] follow-up made for 8/17  Hyponatremia -Resolved  Hypertension -Continue lisinopril  Degenerative changes lumbar and thoracic spine -Pain control  Consultants:   Infectious disease  Procedures:   TEE 7/21   Discharge Exam: Vitals:   10/13/18 0458 10/13/18 1406  BP: 122/76 (!) 164/89  Pulse: 84 98  Resp:  18  Temp: 98.3 F (36.8 C) 98.2 F (36.8 C)  SpO2: 94% 97%    General: Awake oriented x3 Cardiovascular: S1-S2/regular rate rhythm Respiratory: Clear bilaterally  Discharge Instructions   Discharge Instructions    Ambulatory referral to Nutrition and Diabetic Education   Complete by: As directed    Diet - low sodium heart healthy   Complete by: As directed    Diet Carb Modified  Complete by: As directed    Home infusion instructions [facility] May follow [place] Dosing Protocol; May administer  Cathflo as needed to maintain patency of vascular access device.; Flushing of vascular access device: per [facility] Protocol: 0.9% NaCl pre/post medica...   Complete by: As directed    Instructions: May follow [place] Dosing Protocol   Instructions: May administer Cathflo as needed to maintain patency of vascular access device.   Instructions: Flushing of vascular access device: per [facility] Protocol: 0.9% NaCl pre/post medication administration and prn patency; Heparin 100 u/ml, 82m for implanted ports and Heparin 10u/ml, 523mfor all other central venous catheters.   Instructions: May follow [facility] Anaphylaxis Protocol for First Dose Administration in the home: 0.9% NaCl at 25-50 ml/hr to maintain IV access for protocol meds. Epinephrine 0.3 ml IV/IM PRN and Benadryl 25-50 IV/IM PRN s/s of anaphylaxis.   Instructions: [facility] Coordinator (RN) to assist per patient IV care needs in the home PRN.   Increase activity slowly   Complete by: As directed      Allergies as of 10/13/2018   No Known Allergies     Medication List    TAKE these medications   acetaminophen 500 MG tablet Commonly known as: TYLENOL Take 1,000 mg by mouth every 6 (six) hours as needed for mild pain.   blood glucose meter kit and supplies Kit Dispense based on patient and insurance preference. Use up to four times daily as directed. (FOR ICD-9 250.00, 250.01).   Centrum Silver Adult 50+ Tabs Take 1 tablet by mouth daily.   glimepiride 4 MG tablet Commonly known as: AMARYL Take 4 mg by mouth 2 (two) times daily.   Insulin Pen Needle 32G X 4 MM Misc 1 each by Does not apply route 2 (two) times a day.   lisinopril 20 MG tablet Commonly known as: ZESTRIL Take 20 mg by mouth daily.   NovoLOG Mix 70/30 FlexPen (70-30) 100 UNIT/ML FlexPen Generic drug: insulin aspart protamine - aspart Inject 0.2 mLs (20 Units total) into the skin 2 (two) times daily.   pioglitazone 30 MG tablet Commonly known as:  ACTOS Take 30 mg by mouth daily.   vancomycin  IVPB Inject 1,750 mg into the vein every 12 (twelve) hours for 25 days. Indication:  MRSA bacteremia Last Day of Therapy:  11/07/18 Labs - Sunday/Monday:  CBC/D, BMP, and vancomycin trough. Labs - Thursday:  BMP and vancomycin trough Labs - Every other week:  ESR and CRP            Home Infusion Instuctions  (From admission, onward)         Start     Ordered   10/13/18 0000  Home infusion instructions [facility] May follow [facility] Protocol; May administer Cathflo as needed to maintain patency of vascular access device.; Fl[facility] of vascular access [facility]: 0.9% NaCl pre/post medica...    Question Answer Comment  Instructions May follow [facility] Protocol   Instructions May administer Cathflo as needed to maintain patency of vascular access device.   Instructions Flushing of vascular access device: [facility]: 0.9% NaCl pre/post medication administration and prn patency; Heparin 100 u/ml, 74m27mor implanted ports and Heparin 10u/ml, 74ml87mr all other central venous catheters.   Instructions May follow [facility] Anaphylaxis Protocol for First Dose Administration in the home: 0.9% NaCl at 25-50 ml/hr to maintain IV access for protocol meds. Epinephrine 0.3 ml IV/IM  PRN and Benadryl 25-50 IV/IM PRN s/s of anaphylaxis.   Instructions [facility] Infusion Coordinator (RN) to assist per patient IV care needs in the home PRN.      10/13/18 1331         No Known Allergies Follow-up Information    Comer, Okey Regal, MD Follow up.   Specialty: Infectious Diseases Why: 11/08/18 at 3:30 pm. Please call to reschedule if you are unable to make this appointment Contact information: [address] [place] [address] [phone]        Urology. Schedule an appointment as soon as possible for a visit in 1 month(s).        [place]. Go on  11/09/2018.   Why: Hospital follow up arranged at 2:30pm. Please go to the appointment to establish care. They have an onsite pharmacy.  Contact information: [address] [place] [address] [phone]           The results of significant diagnostics from this hospitalization (including imaging, microbiology, ancillary and laboratory) are listed below for reference.    Significant Diagnostic Studies: Mr Knee Right W Wo Contrast  Result Date: 10/10/2018 CLINICAL DATA:  Medial knee pain.  Bacteremia. EXAM: MRI OF THE RIGHT KNEE WITHOUT AND WITH CONTRAST TECHNIQUE: Multiplanar, multisequence MR imaging of the knee was performed before and after the administration of intravenous contrast. CONTRAST:  10 cc Gadavist COMPARISON:  None. FINDINGS: MENISCI Medial meniscus: Grade 1 signal in the posterior horn and midbody without definite extension to a meniscal surface. Lateral meniscus: Grade 3 oblique tear of the posterior horn extends to the inferior meniscal surface as on image 15/6. LIGAMENTS Cruciates:  Unremarkable Collaterals:  Proximal popliteus tendinopathy. CARTILAGE Patellofemoral:  Unremarkable Medial: Moderate degenerative chondral thinning. Mild chondral surface irregularity posteriorly along the medial femoral condyle. Lateral:  Mild to moderate degenerative chondral thinning. Joint: Small knee joint effusion. No definite synovial enhancement to suggest septic joint, although there is some accentuated T1 signal in the joint fluid which could indicate mild complexity. Popliteal Fossa: Mild focal edema and enhancement along a portion of the semitendinosus tendon on image 5/10. Extensor Mechanism: Abnormal T2 signal and enhancement posteriorly in the vastus medialis muscle inferiorly with adjacent subcutaneous edema and enhancement, and possible small microabscess formation measuring about 1.6 by 0.4 by 0.7 cm (volume = 0.2 cm^3) Mild prepatellar subcutaneous edema.  Bones: No significant extra-articular osseous abnormalities identified. Other: No supplemental non-categorized findings. IMPRESSION: 1. Focal myositis potentially with small microabscesses posteriorly in the distal vastus medialis muscle. The area of incipient abscess formation is too small to drain, only about 0.2 cubic cm at this time. 2. Focal peritendinitis around a portion of the semitendinosus tendon, image 5/10. 3. Small but potentially complex knee joint effusion. No definite synovial enhancement in the knee joint to further suggest septic joint. 4. Grade 3 oblique tear of the posterior horn lateral meniscus extending to the inferior meniscal surface. 5. Mild to moderate degrees of degenerative chondral thinning in the medial and lateral compartments. 6. Prepatellar subcutaneous edema. 7. Proximal popliteus tendinopathy. Electronically Signed   By: Van Clines M.D.   On: 10/10/2018 13:16   Ct Renal Stone Study  Result Date: 10/08/2018 CLINICAL DATA:  Mid back pain for the past 3 weeks. EXAM: CT ABDOMEN AND PELVIS WITHOUT CONTRAST TECHNIQUE: Multidetector CT imaging of the abdomen and pelvis was performed following the standard protocol without IV contrast. COMPARISON:  None. FINDINGS: Lower  chest: Minimal pericardial effusion with a maximum thickness of 7 mm. Normal sized heart. Clear lung bases. Hepatobiliary: No focal liver abnormality is seen. No gallstones, gallbladder wall thickening, or biliary dilatation. Pancreas: Unremarkable. No pancreatic ductal dilatation or surrounding inflammatory changes. Spleen: Normal in size without focal abnormality. Adrenals/Urinary Tract: Normal appearing adrenal glands. Mild bilateral perinephric soft tissue stranding extending into the upper pelvis. Unremarkable ureters and urinary bladder. No urinary tract calculi or hydronephrosis seen. Stomach/Bowel: Multiple colonic diverticula. Moderate diffuse low-density rectal wall thickening with extensive  perirectal soft tissue stranding and mild presacral edema. The wall thickening extends into the distal sigmoid colon, less prominent in the distal sigmoid colon compared to the rectum. Surgically absent appendix. Unremarkable stomach and small bowel. Vascular/Lymphatic: Atheromatous arterial calcifications without aneurysm. No enlarged lymph nodes. Reproductive: Moderate-to-marked diffuse enlargement of the prostate gland and seminal vesicles with mild heterogeneity and extensive surrounding soft tissue stranding. Other: Small umbilical hernia containing fat, extending into the supraumbilical region. Musculoskeletal: Lumbar and lower thoracic spine degenerative changes. IMPRESSION: 1. Moderate-to-marked diffuse enlargement of the prostate gland and seminal vesicles with mild heterogeneity and extensive surrounding soft tissue stranding, compatible with marked acute prostatitis. 2. Changes of acute proctitis in distal sigmoid colitis. This most likely secondary to the adjacent marked changes of acute prostatitis. Proctitis and distal colitis secondarily affecting the prostate gland and seminal vesicles is also a possibility. 3. Extensive colonic diverticulosis. 4. Minimal pericardial effusion. 5. Small umbilical hernia containing fat. Electronically Signed   By: Claudie Revering M.D.   On: 10/08/2018 13:23   [place] Ekg Site Rite  Result Date: 10/13/2018 If Site Rite image not attached, placement could not be confirmed due to current cardiac rhythm.   Microbiology: Recent Results (from the past 240 hour(s))  SARS Coronavirus 2 (CEPHEID - Performed in [facility] hospital lab), Hosp Order     Status: None   Collection Time: 10/08/18  2:20 PM   Specimen: Urine, Catheterized; Nasopharyngeal  Result Value Ref Range Status   SARS Coronavirus 2 NEGATIVE NEGATIVE Final    Comment: (NOTE) If result is NEGATIVE SARS-CoV-2 target nucleic acids are NOT DETECTED. The SARS-CoV-2 RNA is generally detectable in upper and  lower  respiratory specimens during the acute phase of infection. The lowest  concentration of SARS-CoV-2 viral copies this assay can detect is 250  copies / mL. A negative result does not preclude SARS-CoV-2 infection  and should not be used as the sole basis for treatment or other  patient management decisions.  A negative result may occur with  improper specimen collection / handling, submission of specimen other  than nasopharyngeal swab, presence of viral mutation(s) within the  areas targeted by this assay, and inadequate number of viral copies  (<250 copies / mL). A negative result must be combined with clinical  observations, patient history, and epidemiological information. If result is POSITIVE SARS-CoV-2 target nucleic acids are DETECTED. The SARS-CoV-2 RNA is generally detectable in upper and lower  respiratory specimens dur ing the acute phase of infection.  Positive  results are indicative of active infection with SARS-CoV-2.  Clinical  correlation with patient history and other diagnostic information is  necessary to determine patient infection status.  Positive results do  not rule out bacterial infection or co-infection with other viruses. If result is PRESUMPTIVE POSTIVE SARS-CoV-2 nucleic acids MAY BE PRESENT.   A presumptive positive result was obtained on the submitted specimen  and confirmed on repeat testing.  While 2019 novel coronavirus  (  SARS-CoV-2) nucleic acids may be present in the submitted sample  additional confirmatory testing may be necessary for epidemiological  and / or clinical management purposes  to differentiate between  SARS-CoV-2 and other Sarbecovirus currently known to infect humans.  If clinically indicated additional testing with an alternate test  methodology [phone]) is advised. The SARS-CoV-2 RNA is generally  detectable in upper and lower respiratory sp ecimens during the acute  phase of infection. The expected result is  Negative. Fact Sheet for Patients:  StrictlyIdeas.no Fact Sheet for Healthcare Providers: BankingDealers.co.za This test is not yet approved or cleared by the [place] FDA and has been authorized for detection and/or diagnosis of SARS-CoV-2 by FDA under an Emergency Use Authorization (EUA).  This EUA will remain in effect (meaning this test can be used) for the duration of the COVID-19 declaration under Section 564(b)(1) of the Act, 21 U.S.C. section 360bbb-3(b)(1), unless the authorization is terminated or revoked sooner. Performed at [facility], [place] [address], [place], [place] [address]   Urine culture     Status: Abnormal   Collection Time: 10/08/18  2:20 PM   Specimen: Urine, Random  Result Value Ref Range Status   Specimen Description URINE, RANDOM  Final   Special Requests NONE  Final   Culture (A)  Final    <10,000 COLONIES/mL INSIGNIFICANT GROWTH Performed at [facility], [place] [address] [place], [place] [address]    Report Status 10/09/2018 FINAL  Final  Culture, blood (routine x 2)     Status: Abnormal   Collection Time: 10/08/18  2:54 PM   Specimen: BLOOD RIGHT HAND  Result Value Ref Range Status   Specimen Description BLOOD RIGHT HAND  Final   Special Requests   Final    BOTTLES DRAWN AEROBIC ONLY Blood Culture results may not be optimal due to an inadequate volume of blood received in culture bottles   Culture  Setup Time   Final    AEROBIC BOTTLE ONLY GRAM POSITIVE COCCI CRITICAL VALUE NOTED.  VALUE IS CONSISTENT WITH PREVIOUSLY REPORTED AND CALLED VALUE.    Culture (A)  Final    STAPHYLOCOCCUS AUREUS SUSCEPTIBILITIES PERFORMED ON PREVIOUS CULTURE WITHIN THE LAST 5 DAYS. Performed at [facility], [place] [address], [place], [place] [address]    Report Status 10/11/2018 FINAL  Final  Culture, blood (routine x 2)     Status: Abnormal   Collection Time: 10/08/18  2:55 PM   Specimen: BLOOD   Result Value Ref Range Status   Specimen Description BLOOD ARM  Final   Special Requests   Final    BOTTLES DRAWN AEROBIC AND ANAEROBIC Blood Culture adequate volume   Culture  Setup Time   Final    IN BOTH AEROBIC AND ANAEROBIC BOTTLES GRAM POSITIVE COCCI CRITICAL RESULT CALLED TO, READ BACK BY AND VERIFIED WITHLenna Sciara [facility] 10/09/18 2841 JDW Performed at [facility], [place] [address] [place], [place] [address]    Culture STAPHYLOCOCCUS AUREUS (A)  Final   Report Status 10/11/2018 FINAL  Final   Organism ID, Bacteria STAPHYLOCOCCUS AUREUS  Final      Susceptibility   Staphylococcus aureus - MIC*    CIPROFLOXACIN >=8 RESISTANT Resistant     ERYTHROMYCIN >=8 RESISTANT Resistant     GENTAMICIN <=0.5 SENSITIVE Sensitive     OXACILLIN >=4 RESISTANT Resistant     TETRACYCLINE <=1 SENSITIVE Sensitive     VANCOMYCIN 1 SENSITIVE Sensitive  TRIMETH/SULFA <=10 SENSITIVE Sensitive     CLINDAMYCIN <=0.25 SENSITIVE Sensitive     RIFAMPIN <=0.5 SENSITIVE Sensitive     Inducible Clindamycin NEGATIVE Sensitive     * STAPHYLOCOCCUS AUREUS  Blood Culture ID Panel (Reflexed)     Status: Abnormal   Collection Time: 10/08/18  2:55 PM  Result Value Ref Range Status   Enterococcus species NOT DETECTED NOT DETECTED Final   Listeria monocytogenes NOT DETECTED NOT DETECTED Final   Staphylococcus species DETECTED (A) NOT DETECTED Final    Comment: CRITICAL RESULT CALLED TO, READ BACK BY AND VERIFIED WITH: J LEDFORD [facility] 10/09/18 6294 JDW    Staphylococcus aureus (BCID) DETECTED (A) NOT DETECTED Final    Comment: Methicillin (oxacillin)-resistant Staphylococcus aureus (MRSA). MRSA is predictably resistant to beta-lactam antibiotics (except ceftaroline). Preferred therapy is vancomycin unless clinically contraindicated. Patient requires contact precautions if  hospitalized. CRITICAL RESULT CALLED TO, READ BACK BY AND VERIFIED WITH: J LEDFORD [facility] 10/09/18 7654 JDW    Methicillin  resistance DETECTED (A) NOT DETECTED Final    Comment: CRITICAL RESULT CALLED TO, READ BACK BY AND VERIFIED WITH: J LEDFORD [facility] 10/09/18 6503 JDW    Streptococcus species NOT DETECTED NOT DETECTED Final   Streptococcus agalactiae NOT DETECTED NOT DETECTED Final   Streptococcus pneumoniae NOT DETECTED NOT DETECTED Final   Streptococcus pyogenes NOT DETECTED NOT DETECTED Final   Acinetobacter baumannii NOT DETECTED NOT DETECTED Final   Enterobacteriaceae species NOT DETECTED NOT DETECTED Final   Enterobacter cloacae complex NOT DETECTED NOT DETECTED Final   Escherichia coli NOT DETECTED NOT DETECTED Final   Klebsiella oxytoca NOT DETECTED NOT DETECTED Final   Klebsiella pneumoniae NOT DETECTED NOT DETECTED Final   Proteus species NOT DETECTED NOT DETECTED Final   Serratia marcescens NOT DETECTED NOT DETECTED Final   Haemophilus influenzae NOT DETECTED NOT DETECTED Final   Neisseria meningitidis NOT DETECTED NOT DETECTED Final   Pseudomonas aeruginosa NOT DETECTED NOT DETECTED Final   Candida albicans NOT DETECTED NOT DETECTED Final   Candida glabrata NOT DETECTED NOT DETECTED Final   Candida krusei NOT DETECTED NOT DETECTED Final   Candida parapsilosis NOT DETECTED NOT DETECTED Final   Candida tropicalis NOT DETECTED NOT DETECTED Final    Comment: Performed at [facility], [place] [address] [place], [place] [address]  Culture, blood (Routine X 2) w Reflex to ID Panel     Status: None (Preliminary result)   Collection Time: 10/10/18  2:15 PM   Specimen: BLOOD  Result Value Ref Range Status   Specimen Description BLOOD RIGHT ANTECUBITAL  Final   Special Requests AEROBIC BOTTLE ONLY Blood Culture adequate volume  Final   Culture   Final    NO GROWTH 3 DAYS Performed at [facility], [place] [address], [place], [place] [address]    Report Status PENDING  Incomplete  Culture, blood (Routine X 2) w Reflex to ID Panel     Status: None (Preliminary result)   Collection Time:  10/10/18  2:15 PM   Specimen: BLOOD  Result Value Ref Range Status   Specimen Description BLOOD RIGHT ANTECUBITAL  Final   Special Requests AEROBIC BOTTLE ONLY Blood Culture adequate volume  Final   Culture   Final    NO GROWTH 3 DAYS Performed at [facility], [place] [address], [place], [place] [address]    Report Status PENDING  Incomplete  MRSA PCR Screening     Status: Abnormal   Collection Time: 10/11/18  9:13 AM   Specimen: Nasopharyngeal  Result Value Ref Range Status   MRSA by PCR POSITIVE (A) NEGATIVE Final    Comment:        The GeneXpert MRSA Assay (FDA approved for NASAL specimens only), is one component of a comprehensive MRSA colonization surveillance program. It is not intended to diagnose MRSA infection nor to guide or monitor treatment for MRSA infections. RESULT CALLED TO, READ BACK BY AND VERIFIED WITH: Burna Mortimer RN 11:05 10/11/18 ([email or web address]) Performed at [facility], [place] [address], [place], [place] [address]      Labs: Basic Metabolic Panel: Recent Labs  Lab 10/09/18 0225 10/10/18 0334 10/11/18 0554 10/12/18 0457 10/13/18 0324  NA 129* 130* 132* 136 134*  K 3.9 3.5 3.7 4.3 3.6  CL 97* 98 101 105 103  CO2 21* '22 23 24 23  ' GLUCOSE 280* 256* 175* 134* 141*  BUN '11 10 14 10 9  ' CREATININE 0.73 0.69 0.81 0.85 0.71  CALCIUM 8.2* 7.8* 7.8* 8.2* 7.8*   Liver Function Tests: Recent Labs  Lab 10/08/18 1057  AST 15  ALT 22  ALKPHOS 100  BILITOT 0.9  PROT 6.8  ALBUMIN 2.5*   No results for input(s): LIPASE, AMYLASE in the last 168 hours. No results for input(s): AMMONIA in the last 168 hours. CBC: Recent Labs  Lab 10/08/18 1057 10/09/18 0225 10/10/18 0334 10/11/18 0554 10/12/18 0457 10/13/18 0324  WBC 25.4* 26.2* 21.1* 16.6* 16.3* 14.6*  NEUTROABS 22.7*  --   --   --   --   --   HGB 14.4 12.7* 12.1* 11.5* 11.9* 11.6*  HCT 43.1 38.1* 35.8* 33.7* 35.0* 34.4*  MCV 89.2 88.6 88.2 87.1 87.7 88.9  PLT 266 296 287 298 324 347    Cardiac Enzymes: No results for input(s): CKTOTAL, CKMB, CKMBINDEX, TROPONINI in the last 168 hours. BNP: BNP (last 3 results) No results for input(s): BNP in the last 8760 hours.  ProBNP (last 3 results) No results for input(s): PROBNP in the last 8760 hours.  CBG: Recent Labs  Lab 10/12/18 1614 10/12/18 2146 10/13/18 0459 10/13/18 0804 10/13/18 1157  GLUCAP 159* 211* 125* 113* 171*       Signed:  [name] MD.  [facility] 10/13/2018, 3:01 PM

## 2018-10-15 LAB — CULTURE, BLOOD (ROUTINE X 2)
Culture: NO GROWTH
Culture: NO GROWTH
Special Requests: ADEQUATE
Special Requests: ADEQUATE

## 2018-10-28 ENCOUNTER — Encounter: Payer: Self-pay | Admitting: Internal Medicine

## 2018-11-08 ENCOUNTER — Ambulatory Visit (INDEPENDENT_AMBULATORY_CARE_PROVIDER_SITE_OTHER): Payer: Self-pay | Admitting: Internal Medicine

## 2018-11-08 ENCOUNTER — Encounter: Payer: Self-pay | Admitting: Internal Medicine

## 2018-11-08 ENCOUNTER — Other Ambulatory Visit: Payer: Self-pay

## 2018-11-08 DIAGNOSIS — B9562 Methicillin resistant Staphylococcus aureus infection as the cause of diseases classified elsewhere: Secondary | ICD-10-CM

## 2018-11-08 DIAGNOSIS — M25561 Pain in right knee: Secondary | ICD-10-CM

## 2018-11-08 DIAGNOSIS — M25562 Pain in left knee: Secondary | ICD-10-CM

## 2018-11-08 DIAGNOSIS — R7881 Bacteremia: Secondary | ICD-10-CM

## 2018-11-08 DIAGNOSIS — Z452 Encounter for adjustment and management of vascular access device: Secondary | ICD-10-CM

## 2018-11-08 DIAGNOSIS — N410 Acute prostatitis: Secondary | ICD-10-CM

## 2018-11-08 NOTE — Progress Notes (Signed)
Per verbal order from Dr Linus Salmons, 42 cm Single Lumen Peripherally Inserted Central Catheter removed from right basilic, tip intact. No sutures present. RN confirmed length per chart. Dressing was clean and dry, skin with rash and maceration under dressing. Petroleum dressing applied. Pt advised no heavy lifting with this arm, leave dressing for 24 hours and call the office or seek emergent care if dressing becomes soaked with blood or swelling or sharp pain presents. Patient and his wife verbalized understanding and agreement.  Patient's questions answered to their satisfaction. Patient tolerated procedure well, RN walked patient to check out. [facility]) and [facility] [place] notified. [name], RN

## 2018-11-08 NOTE — Assessment & Plan Note (Signed)
Removed today in clinic.  No follow up indicated.  rtc prn

## 2018-11-08 NOTE — Assessment & Plan Note (Signed)
Clinically resolved and likely secondary to MRSA.  No further treatment indicated.

## 2018-11-08 NOTE — Progress Notes (Signed)
   Subjective:    Patient ID: Carl Baker, male    DOB: 23-Sep-1960, 58 y.o.   MRN: [id number]  HPI Here for hsfu. Was hospitalized for MRSA bacteremia noted after2 weeks of malaise, back and knee pain.  CT with prostatitis.  TEE negative for vegetation and treated for 4 weeks with IV vancomycin with prostatitis.  Finished yesterday.  No issues on labs with creat.  No further knee or back pain.  No issues with picc line. Some rash around groin, improved with OTC antifungal/diaper rash cream.    Review of Systems  Constitutional: Negative for chills and fever.  Gastrointestinal: Negative for diarrhea and nausea.  Skin: Negative for rash.       Objective:   Physical Exam Constitutional:      Appearance: Normal appearance.  Eyes:     General: No scleral icterus. Cardiovascular:     Rate and Rhythm: Normal rate and regular rhythm.  Musculoskeletal:     Comments: picc in right arm and some erythema surrounding site  Skin:    Findings: No rash.  Neurological:     General: No focal deficit present.     Mental Status: He is alert.  Psychiatric:        Mood and Affect: Mood normal.   SH: former smoker        Assessment & Plan:

## 2018-11-08 NOTE — Assessment & Plan Note (Signed)
Resolved and no further issue.s

## 2018-11-08 NOTE — Assessment & Plan Note (Signed)
Resolved, no further issues. 

## 2018-11-09 ENCOUNTER — Inpatient Hospital Stay: Payer: Self-pay | Admitting: Nurse Practitioner

## 2019-09-10 ENCOUNTER — Other Ambulatory Visit: Payer: Self-pay

## 2019-09-10 ENCOUNTER — Emergency Department (HOSPITAL_COMMUNITY): Payer: Self-pay

## 2019-09-10 ENCOUNTER — Emergency Department (HOSPITAL_COMMUNITY)
Admission: EM | Admit: 2019-09-10 | Discharge: 2019-09-10 | Discharge status: Against Medical Advice | Payer: Self-pay | Attending: Emergency Medicine | Admitting: Emergency Medicine

## 2019-09-10 ENCOUNTER — Encounter (HOSPITAL_COMMUNITY): Payer: Self-pay | Admitting: Emergency Medicine

## 2019-09-10 DIAGNOSIS — W19XXXA Unspecified fall, initial encounter: Secondary | ICD-10-CM

## 2019-09-10 DIAGNOSIS — Z794 Long term (current) use of insulin: Secondary | ICD-10-CM | POA: Insufficient documentation

## 2019-09-10 DIAGNOSIS — S6992XA Unspecified injury of left wrist, hand and finger(s), initial encounter: Secondary | ICD-10-CM | POA: Insufficient documentation

## 2019-09-10 DIAGNOSIS — I10 Essential (primary) hypertension: Secondary | ICD-10-CM | POA: Insufficient documentation

## 2019-09-10 DIAGNOSIS — M25532 Pain in left wrist: Secondary | ICD-10-CM

## 2019-09-10 DIAGNOSIS — Y999 Unspecified external cause status: Secondary | ICD-10-CM | POA: Insufficient documentation

## 2019-09-10 DIAGNOSIS — Z79899 Other long term (current) drug therapy: Secondary | ICD-10-CM | POA: Insufficient documentation

## 2019-09-10 DIAGNOSIS — Y9301 Activity, walking, marching and hiking: Secondary | ICD-10-CM | POA: Insufficient documentation

## 2019-09-10 DIAGNOSIS — E119 Type 2 diabetes mellitus without complications: Secondary | ICD-10-CM | POA: Insufficient documentation

## 2019-09-10 DIAGNOSIS — Y929 Unspecified place or not applicable: Secondary | ICD-10-CM | POA: Insufficient documentation

## 2019-09-10 DIAGNOSIS — W010XXA Fall on same level from slipping, tripping and stumbling without subsequent striking against object, initial encounter: Secondary | ICD-10-CM | POA: Insufficient documentation

## 2019-09-10 DIAGNOSIS — Z87891 Personal history of nicotine dependence: Secondary | ICD-10-CM | POA: Insufficient documentation

## 2019-09-10 NOTE — ED Notes (Signed)
Pt left prior to being formerly discharged by PA.

## 2019-09-10 NOTE — Progress Notes (Signed)
Orthopedic Tech Progress Note Patient Details:  Carl Baker 11-27-1960 [id number]  Ortho Devices Type of Ortho Device: Velcro wrist splint Ortho Device/Splint Location: uLE Ortho Device/Splint Interventions: Ordered, Application   Post Interventions Patient Tolerated: Well Instructions Provided: Care of device   Sallie Maker A [name] 09/10/2019, 12:20 PM

## 2019-09-10 NOTE — ED Triage Notes (Signed)
Pt. Stated, I fell on Thursday and hurt my left wrist.  Wrist is swollen.

## 2019-09-10 NOTE — ED Provider Notes (Signed)
[facility] EMERGENCY DEPARTMENT Provider Note   CSN: [id number] Arrival date & time: 09/10/19  8882     History Chief Complaint  Patient presents with  . Wrist Pain  . Fall    Carl Baker is a 59 y.o. male presents to ER for evaluation of left wrist pain after a fall on Thursday around 2 pm. Patient states he tripped over a plastic rope and fell forward.  States he braced his fall by catching himself on his both hands.  He has associated swelling and a small abrasion of the left arm.  States he has no pain in his wrist unless he moves it side to side and flexes it.  Denies previous injury in this joint.  He is right-hand dominant.  Denies any other injuries from the fall.  No distal tingling or numbness in the thumb.  No associated elbow pain.  He purchased an over-the-counter wrist brace at [facility] but he took it off due to increased swelling. HPI     Past Medical History:  Diagnosis Date  . Diabetes mellitus without complication ([place])   . Hypercholesteremia   . Hypertension   . Knee pain 10/09/2018  . MRSA bacteremia 10/09/2018    Patient Active Problem List   Diagnosis Date Noted  . Dehydration with hyponatremia 10/08/2018  . Hypertension   . Hypercholesteremia   . Diabetes mellitus without complication [facility])     Past Surgical History:  Procedure Laterality Date  . APPENDECTOMY    . BUBBLE STUDY  10/12/2018   Procedure: BUBBLE STUDY;  Surgeon: Elouise Munroe, MD;  Location: [place];  Service: Cardiology;;  . TEE WITHOUT CARDIOVERSION N/A 10/12/2018   Procedure: TRANSESOPHAGEAL ECHOCARDIOGRAM (TEE);  Surgeon: Elouise Munroe, MD;  Location: [place];  Service: Cardiology;  Laterality: N/A;  . WRIST FRACTURE SURGERY         No family history on file.  Social History   Tobacco Use  . Smoking status: Former Smoker    Quit date: 1992    Years since quitting: 29.4  . Smokeless tobacco: Never Used  Substance Use Topics  .  Alcohol use: Yes    Comment: none recently; usually with buddies, maybe total of 12 since COVID started  . Drug use: Never    Types: Marijuana    Comment: quit about 6-7 years ago    Home Medications Prior to Admission medications   Medication Sig Start Date End Date Taking? Authorizing Provider  acetaminophen (TYLENOL) 500 MG tablet Take 1,000 mg by mouth every 6 (six) hours as needed for mild pain.    [provider]  blood glucose meter kit and supplies KIT Dispense based on patient and insurance preference. Use up to four times daily as directed. (FOR ICD-9 250.00, 250.01). 10/13/18   [name], MD  glimepiride (AMARYL) 4 MG tablet Take 4 mg by mouth 2 (two) times daily. 10/04/18   [provider]  insulin aspart protamine - aspart (NOVOLOG MIX 70/30 FLEXPEN) (70-30) 100 UNIT/ML FlexPen Inject 0.2 mLs (20 Units total) into the skin 2 (two) times daily. 10/13/18   [name], MD  Insulin Pen Needle 32G X 4 MM MISC 1 each by Does not apply route 2 (two) times a day. 10/13/18   [name], MD  lisinopril (ZESTRIL) 20 MG tablet Take 20 mg by mouth daily. 10/04/18   [provider]  Multiple Vitamins-Minerals (CENTRUM SILVER ADULT 50+) TABS Take 1 tablet by mouth daily.  [provider]  pioglitazone (ACTOS) 30 MG tablet Take 30 mg by mouth daily. 10/04/18   [provider]    Allergies    Patient has no known allergies.  Review of Systems   Review of Systems  Musculoskeletal: Positive for arthralgias and joint swelling.  All other systems reviewed and are negative.   Physical Exam Updated Vital Signs BP (!) 151/104 (BP Location: Right Arm)   Pulse 91   Temp 98.1 F (36.7 C) (Oral)   Resp 18   SpO2 100%   Physical Exam Constitutional:      Appearance: He is well-developed.  HENT:     Head: Normocephalic.     Nose: Nose normal.  Eyes:     General: Lids are normal.  Cardiovascular:     Rate and Rhythm: Normal rate.       Comments: 1+ radial pulse in LUE Pulmonary:     Effort: Pulmonary effort is normal. No respiratory distress.  Musculoskeletal:        General: Swelling and tenderness present. Normal range of motion.     Cervical back: Normal range of motion.     Comments:  Wrist: Diffuse diffuse mild circumferential swelling of the left wrist.  Focal tenderness at the ventral aspect of the wrist, around the styloid process of the radius.  Patient has full range of motion of the wrist but reports pain with flexion, abduction and abduction.  No snuffbox tenderness.  Negative thumb compression test.  Full range of motion of the thumb without any pain.  No other focal bony tenderness of the ulna, mid or proximal radius.  Skin:    Comments:  small abrasion on the palm of the left hand  Neurological:     Mental Status: He is alert.     Comments: Sensation to light touch and strength in the median, radial, ulnar nerve distribution intact in the left upper extremity  Psychiatric:        Behavior: Behavior normal.     ED Results / Procedures / Treatments   Labs (all labs ordered are listed, but only abnormal results are displayed) Labs Reviewed - No data to display  EKG None  Radiology DG Wrist Complete Left  Result Date: 09/10/2019 CLINICAL DATA:  Patient fell on wrist a few days ago. Pain and swelling. EXAM: LEFT WRIST - COMPLETE 3+ VIEW COMPARISON:  None. FINDINGS: A rounded region of increased attenuation of the distal radius is likely a bone island. No carpal fractures are identified. Visualized proximal metacarpals are normal. A cluster of apparent calcifications projecting over the soft tissues adjacent to the distal radius on the oblique view are identified. No donor site to suggest fracture. No wrist dislocation is identified. No other abnormalities. IMPRESSION: 1. A cluster of high attenuation foci project over the soft tissues adjacent to the radial metaphysis. These are age indeterminate but may  be nonacute and of uncertain etiology. No donor site to definitively suggest fracture. No fractures are identified. Electronically Signed   By: Dorise Bullion III M.D   On: 09/10/2019 10:45    Procedures Procedures (including critical care time)  Medications Ordered in ED Medications - No data to display  ED Course  I have reviewed the triage vital signs and the nursing notes.  Pertinent labs & imaging results that were available during my care of the patient were reviewed by me and considered in my medical decision making (see chart for details).  Clinical Course as of Sep 10 3210  Sat Sep 10, 2019  1145 A rounded region of increased attenuation of the distal radius is likely a bone island. No carpal fractures are identified. Visualized proximal metacarpals are normal. A cluster of apparent calcifications projecting over the soft tissues adjacent to the distal radius on the oblique view are identified. No donor site to suggest fracture. No wrist dislocation is identified. No other abnormalities.  DG Wrist Complete Left [CG]    Clinical Course User Index [CG] Kinnie Feil, PA-C   MDM Rules/Calculators/A&P                          59 year old right-handed male presents for traumatic left wrist pain after fall 2 days ago.  X-ray ordered in triage.  This was personally reviewed and interpreted.  There are 2 rounded areas of calcifications at the distal radius.  Radiologist suspects a bone island because there are no signs of a donor site to suggest fracture.  On exam patient has local point tenderness around this area.  He has not had any previous wrist injuries before.  Discussed this finding and showed x-ray to patient and wife.  Will be conservative and treat as possible avulsion type fracture/soft tissue injury of the wrist.  He has no snuffbox tenderness, negative thumb compression test.  Doubt scaphoid injury.  No other associated injuries after the fall.  Discussed  with patient plan to do wrist splint, rice, follow-up with hand specialist.  Patient and wife were in agreement with this.  I was notified by nurse that patient walked out without AVS.  Final Clinical Impression(s) / ED Diagnoses Final diagnoses:  Fall, initial encounter  Left wrist pain  Soft tissue injury of left wrist, initial encounter    Rx / DC Orders ED Discharge Orders    None       Arlean Hopping 09/10/19 1518    Lacretia Leigh, MD 09/11/19 [phone]

## 2019-09-10 NOTE — ED Notes (Signed)
Ortho tech called 

## 2019-09-27 ENCOUNTER — Encounter (HOSPITAL_COMMUNITY): Payer: Self-pay | Admitting: Emergency Medicine

## 2019-09-27 ENCOUNTER — Emergency Department (HOSPITAL_COMMUNITY): Payer: Self-pay

## 2019-09-27 ENCOUNTER — Emergency Department (HOSPITAL_COMMUNITY)
Admission: EM | Admit: 2019-09-27 | Discharge: 2019-09-27 | Disposition: A | Discharge status: Home / Self Care | Payer: Self-pay | Attending: Emergency Medicine | Admitting: Emergency Medicine

## 2019-09-27 DIAGNOSIS — Z794 Long term (current) use of insulin: Secondary | ICD-10-CM | POA: Insufficient documentation

## 2019-09-27 DIAGNOSIS — Z87891 Personal history of nicotine dependence: Secondary | ICD-10-CM | POA: Insufficient documentation

## 2019-09-27 DIAGNOSIS — E119 Type 2 diabetes mellitus without complications: Secondary | ICD-10-CM | POA: Insufficient documentation

## 2019-09-27 DIAGNOSIS — I10 Essential (primary) hypertension: Secondary | ICD-10-CM | POA: Insufficient documentation

## 2019-09-27 DIAGNOSIS — Z79899 Other long term (current) drug therapy: Secondary | ICD-10-CM | POA: Insufficient documentation

## 2019-09-27 DIAGNOSIS — M25511 Pain in right shoulder: Secondary | ICD-10-CM | POA: Insufficient documentation

## 2019-09-27 MED ORDER — CYCLOBENZAPRINE HCL 10 MG PO TABS
10.0000 mg | ORAL_TABLET | Freq: Once | ORAL | Status: AC
  Administered 2019-09-27: 10 mg via ORAL
  Filled 2019-09-27: qty 1

## 2019-09-27 MED ORDER — HYDROCODONE-ACETAMINOPHEN 5-325 MG PO TABS
1.0000 | ORAL_TABLET | Freq: Once | ORAL | Status: AC
  Administered 2019-09-27: 1 via ORAL
  Filled 2019-09-27: qty 1

## 2019-09-27 MED ORDER — PREDNISONE 10 MG PO TABS
40.0000 mg | ORAL_TABLET | Freq: Every day | ORAL | 0 refills | Status: AC
Start: 2019-09-27 — End: 2019-10-02

## 2019-09-27 MED ORDER — HYDROCODONE-ACETAMINOPHEN 5-325 MG PO TABS: 1.0000 | ORAL_TABLET | Freq: Four times a day (QID) | ORAL | 0 refills | Status: AC | PRN

## 2019-09-27 NOTE — ED Provider Notes (Signed)
[facility] EMERGENCY DEPARTMENT Provider Note   CSN: [id number] Arrival date & time: 09/27/19  3086     History Chief Complaint  Patient presents with  . Shoulder Pain    Carl Baker is a 59 y.o. male.  Patient is a 59 year old gentleman who has past medical history of hypercholesterolemia, hypertension, diabetes presenting to the emergency department for shoulder pain.  He reports that he fell about a month ago and injured his left arm.  Reports that initially it was mostly just his wrist that was hurting which is now improved but over the last 4 days he has had difficulty with raising his left shoulder.  Pain radiates from the posterior shoulder into his neck.  Also has history of pinched nerves in his neck.  Reports pain and difficulty with even though reaching up to turn his light switches on and off.  Has tried over-the-counter medications without relief.  He has not taken his blood pressure medication today        Past Medical History:  Diagnosis Date  . Diabetes mellitus without complication ([place])   . Hypercholesteremia   . Hypertension   . Knee pain 10/09/2018  . MRSA bacteremia 10/09/2018    Patient Active Problem List   Diagnosis Date Noted  . Dehydration with hyponatremia 10/08/2018  . Hypertension   . Hypercholesteremia   . Diabetes mellitus without complication Carl Baker)     Past Surgical History:  Procedure Laterality Date  . APPENDECTOMY    . BUBBLE STUDY  10/12/2018   Procedure: BUBBLE STUDY;  Surgeon: Elouise Munroe, MD;  Location: [place];  Service: Cardiology;;  . TEE WITHOUT CARDIOVERSION N/A 10/12/2018   Procedure: TRANSESOPHAGEAL ECHOCARDIOGRAM (TEE);  Surgeon: Elouise Munroe, MD;  Location: [place];  Service: Cardiology;  Laterality: N/A;  . WRIST FRACTURE SURGERY         No family history on file.  Social History   Tobacco Use  . Smoking status: Former Smoker    Quit date: 1992    Years since quitting:  29.5  . Smokeless tobacco: Never Used  Substance Use Topics  . Alcohol use: Yes    Comment: none recently; usually with buddies, maybe total of 12 since COVID started  . Drug use: Never    Types: Marijuana    Comment: quit about 6-7 years ago    Home Medications Prior to Admission medications   Medication Sig Start Date End Date Taking? Authorizing Provider  acetaminophen (TYLENOL) 500 MG tablet Take 1,000 mg by mouth every 6 (six) hours as needed for mild pain.    [provider]  blood glucose meter kit and supplies KIT Dispense based on patient and insurance preference. Use up to four times daily as directed. (FOR ICD-9 250.00, 250.01). 10/13/18   [name], MD  glimepiride (AMARYL) 4 MG tablet Take 4 mg by mouth 2 (two) times daily. 10/04/18   [provider]  HYDROcodone-acetaminophen (NORCO/VICODIN) 5-325 MG tablet Take 1 tablet by mouth every 6 (six) hours as needed for up to 2 days for severe pain. 09/27/19 09/29/19  [name], PA-C  insulin aspart protamine - aspart (NOVOLOG MIX 70/30 FLEXPEN) (70-30) 100 UNIT/ML FlexPen Inject 0.2 mLs (20 Units total) into the skin 2 (two) times daily. 10/13/18   [name], MD  Insulin Pen Needle 32G X 4 MM MISC 1 each by Does not apply route 2 (two) times a day. 10/13/18   [name], MD  lisinopril (ZESTRIL)  20 MG tablet Take 20 mg by mouth daily. 10/04/18   [provider]  Multiple Vitamins-Minerals (CENTRUM SILVER ADULT 50+) TABS Take 1 tablet by mouth daily.    [provider]  pioglitazone (ACTOS) 30 MG tablet Take 30 mg by mouth daily. 10/04/18   [provider]  predniSONE (DELTASONE) 10 MG tablet Take 4 tablets (40 mg total) by mouth daily for 5 days. 09/27/19 10/02/19  [name], PA-C    Allergies    Patient has no known allergies.  Review of Systems   Review of Systems  Constitutional: Negative for chills and fever.  Respiratory: Negative for cough and shortness of  breath.   Gastrointestinal: Negative for nausea and vomiting.  Musculoskeletal: Positive for arthralgias and neck pain.  Skin: Negative for rash and wound.  Neurological: Negative for tremors, weakness and numbness.  All other systems reviewed and are negative.   Physical Exam Updated Vital Signs BP (!) 192/99   Pulse (!) 102   Temp 98 F (36.7 C) (Oral)   Resp 16   SpO2 96%   Physical Exam Vitals and nursing note reviewed.  Constitutional:      General: He is not in acute distress.    Appearance: Normal appearance. He is not ill-appearing, toxic-appearing or diaphoretic.  HENT:     Head: Normocephalic.  Eyes:     Conjunctiva/sclera: Conjunctivae normal.  Pulmonary:     Effort: Pulmonary effort is normal.  Musculoskeletal:     Left shoulder: Tenderness present. No swelling, deformity, effusion, laceration, bony tenderness or crepitus. Decreased range of motion. Normal strength. Normal pulse.     Comments: Patient has decreased range of motion.  Unable to abduct or flex the left shoulder more than 85 degrees.  Tenderness to palpation in the posterior shoulder.  Skin:    General: Skin is dry.     Capillary Refill: Capillary refill takes less than 2 seconds.  Neurological:     Mental Status: He is alert.     Sensory: No sensory deficit.     Motor: No weakness.  Psychiatric:        Mood and Affect: Mood normal.     ED Results / Procedures / Treatments   Labs (all labs ordered are listed, but only abnormal results are displayed) Labs Reviewed - No data to display  EKG None  Radiology DG Shoulder Left  Result Date: 09/27/2019 CLINICAL DATA:  Left shoulder pain EXAM: LEFT SHOULDER - 2+ VIEW COMPARISON:  None. FINDINGS: There is no evidence of fracture or dislocation. Mild arthropathy of the [facility] joint. Glenohumeral joint appears within normal limits. Soft tissues are unremarkable. IMPRESSION: Mild arthropathy of the left AC joint. Electronically Signed   By: Davina Poke D.O.   On: 09/27/2019 11:32    Procedures Procedures (including critical care time)  Medications Ordered in ED Medications  HYDROcodone-acetaminophen (NORCO/VICODIN) 5-325 MG per tablet 1 tablet (1 tablet Oral Given 09/27/19 1110)  cyclobenzaprine (FLEXERIL) tablet 10 mg (10 mg Oral Given 09/27/19 1110)    ED Course  I have reviewed the triage vital signs and the nursing notes.  Pertinent labs & imaging results that were available during my care of the patient were reviewed by me and considered in my medical decision making (see chart for details).  Clinical Course as of Sep 26 1236  Tue Sep 27, 2019  1141 Patient with shoulder pain and decreased range of motion after fall about 1 month ago.  X-ray shows AC  joint arthritis.  However, given his physical exam I suspect a nerve impingement or rotator cuff injury.  He was given medicine for pain control and orthopedics follow-up.  Of note, blood pressure was elevated to 192/99.  He reports that his blood pressure is usually elevated and he has not taken his blood pressure medication today.  We discussed the importance of taking his blood pressure medication.   [KM]    Clinical Course User Index [KM] Kristine Royal   MDM Rules/Calculators/A&P                          Based on review of vitals, medical screening exam, lab work and/or imaging, there does not appear to be an acute, emergent etiology for the patient's symptoms. Counseled pt on good return precautions and encouraged both PCP and ED follow-up as needed.  Prior to discharge, I also discussed incidental imaging findings with patient in detail and advised appropriate, recommended follow-up in detail.  Clinical Impression: 1. Acute pain of right shoulder     Disposition: Discharge  Prior to providing a prescription for a controlled substance, I independently reviewed the patient's recent prescription history on the [place]. The patient had no recent or regular prescriptions and was deemed appropriate for a brief, less than 3 day prescription of narcotic for acute analgesia.  This note was prepared with assistance of Systems analyst. Occasional wrong-word or sound-a-like substitutions may have occurred due to the inherent limitations of voice recognition software.  Final Clinical Impression(s) / ED Diagnoses Final diagnoses:  Acute pain of right shoulder    Rx / DC Orders ED Discharge Orders         Ordered    HYDROcodone-acetaminophen (NORCO/VICODIN) 5-325 MG tablet  Every 6 hours PRN     Discontinue  Reprint     09/27/19 1238    predniSONE (DELTASONE) 10 MG tablet  Daily     Discontinue  Reprint     09/27/19 1238           Kristine Royal 09/27/19 1238    Quintella Reichert, MD 09/27/19 1240

## 2019-09-27 NOTE — Discharge Instructions (Addendum)
You are seen today for shoulder pain.  Your x-ray showed you have some arthritis.  I also believe that you probably have a soft tissue injury.  You will need to see orthopedics.  Please give them a call today to set up an appointment.  In the meantime, you can take medication for pain relief.  Please be aware that this medication can cause dizziness, drowsiness, addiction and overdose.  Take medication only as prescribed and only if in severe pain

## 2019-09-27 NOTE — ED Notes (Signed)
Patient transported to x-ray. ?

## 2019-09-27 NOTE — ED Triage Notes (Addendum)
Pt reports L upper arm pain for the past few days, states he cannot sleep at night. States pain goes from neck down to his elbow. Reports a fall a few weeks ago and thinks he may have injured his shoulder then. States that he has not been able to move his arm until today. Taking otc pain meds without relief.

## 2020-06-27 ENCOUNTER — Encounter (HOSPITAL_COMMUNITY): Payer: Self-pay | Admitting: Emergency Medicine

## 2020-06-27 ENCOUNTER — Ambulatory Visit (HOSPITAL_COMMUNITY)
Admission: EM | Admit: 2020-06-27 | Discharge: 2020-06-27 | Disposition: A | Discharge status: Home / Self Care | Payer: Self-pay | Attending: Emergency Medicine | Admitting: Emergency Medicine

## 2020-06-27 ENCOUNTER — Other Ambulatory Visit: Payer: Self-pay

## 2020-06-27 DIAGNOSIS — R21 Rash and other nonspecific skin eruption: Secondary | ICD-10-CM

## 2020-06-27 DIAGNOSIS — I10 Essential (primary) hypertension: Secondary | ICD-10-CM

## 2020-06-27 DIAGNOSIS — L03115 Cellulitis of right lower limb: Secondary | ICD-10-CM

## 2020-06-27 MED ORDER — NYSTATIN 100000 UNIT/GM EX CREA: TOPICAL_CREAM | CUTANEOUS | 0 refills | Status: DC

## 2020-06-27 MED ORDER — DOXYCYCLINE HYCLATE 100 MG PO TABS
100.0000 mg | ORAL_TABLET | Freq: Two times a day (BID) | ORAL | 0 refills | Status: DC
Start: 2020-06-27 — End: 2020-07-08

## 2020-06-27 MED ORDER — CLOBETASOL PROPIONATE 0.05 % EX OINT: 1.0000 "application " | TOPICAL_OINTMENT | Freq: Two times a day (BID) | CUTANEOUS | 0 refills | Status: AC

## 2020-06-27 NOTE — Discharge Instructions (Addendum)
Follow up with your Primary Care provider as soon as you are able to

## 2020-06-27 NOTE — ED Triage Notes (Signed)
Pt presents with rash bilaterally on lower legs. States started approx 5 days ago as 3 small red spots and spread to both legs.  States has used multiple topical medications with no relief.   States has not had BP medication for 2-3 weeks and is also out of diabetes medication.

## 2020-06-30 NOTE — ED Provider Notes (Addendum)
[facility]    CSN: [id number] Arrival date & time: 06/27/20  0905      History   Chief Complaint Chief Complaint  Patient presents with  . Rash    HPI Carl Baker is a 60 y.o. male.   Patient presenting today with with bilateral lower leg rash.  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  He states that the rash does seem to be improving over the last 2 days but still present extensively.  The rash itself is not painful but is itchy and he now has a red, swollen, warm area at sole of right foot that is significantly painful where he has been picking at the rash.  Patient states he has never had anything like this before, no chronic dermatologic issues Has not tried anything over-the-counter for symptoms.  Does have a history of diabetes and hypertension.  States has been off these medications for several weeks now is has not been able to get back into his PCP.  He denies fever, chills, body aches, difficulty breathing, throat swelling, drainage from any of the areas.      Past Medical History:  Diagnosis Date  . Diabetes mellitus without complication (HCC)   . Hypercholesteremia   . Hypertension   . Knee pain 10/09/2018  . MRSA bacteremia 10/09/2018    Patient Active Problem List   Diagnosis Date Noted  . Dehydration with hyponatremia 10/08/2018  . Hypertension   . Hypercholesteremia   . Diabetes mellitus without complication (HCC)     Past Surgical History:  Procedure Laterality Date  . APPENDECTOMY    . BUBBLE STUDY  10/12/2018   Procedure: BUBBLE STUDY;  Surgeon: Acharya, Gayatri A, MD;  Location: [facility];  Service: Cardiology;;  . TEE WITHOUT CARDIOVERSION N/A 10/12/2018   Procedure: TRANSESOPHAGEAL ECHOCARDIOGRAM (TEE);  Surgeon: Acharya, Gayatri A, MD;  Location: [facility];  Service: Cardiology;  Laterality:  N/A;  . WRIST FRACTURE SURGERY         Home Medications    Prior to Admission medications   Medication Sig Start Date End Date Taking? Authorizing Provider  clobetasol ointment (TEMOVATE) 0.05 % Apply 1 application topically 2 (two) times daily. 06/27/20  Yes Lane, Rachel Elizabeth, PA-C  doxycycline (VIBRA-TABS) 100 MG tablet Take 1 tablet (100 mg total) by mouth 2 (two) times daily. 06/27/20  Yes Lane, Rachel Elizabeth, PA-C  acetaminophen (TYLENOL) 500 MG tablet Take 1,000 mg by mouth every 6 (six) hours as needed for mild pain.    [provider]  blood glucose meter kit and supplies KIT Dispense based on patient and insurance preference. Use up to four times daily as directed. (FOR ICD-9 250.00, 250.01). 10/13/18   Joseph, Preetha, MD  glimepiride (AMARYL) 4 MG tablet Take 4 mg by mouth 2 (two) times daily. 10/04/18   [provider]  insulin aspart protamine - aspart (NOVOLOG MIX 70/30 FLEXPEN) (70-30) 100 UNIT/ML FlexPen Inject 0.2 mLs (20 Units total) into the skin 2 (two) times daily. 10/13/18   Joseph, Preetha, MD  Insulin Pen Needle 32G X 4 MM MISC 1 each by Does not apply route 2 (two) times a day. 10/13/18   Joseph, Preetha, MD  lisinopril (ZESTRIL) 20 MG tablet Take 20 mg by mouth daily. 10/04/18   [provider]  Multiple Vitamins-Minerals (CENTRUM SILVER ADULT   50+) TABS Take 1 tablet by mouth daily.    [provider]  pioglitazone (ACTOS) 30 MG tablet Take 30 mg by mouth daily. 10/04/18   [provider]    Family History History reviewed. No pertinent family history.  Social History Social History   Tobacco Use  . Smoking status: Former Smoker    Quit date: 1992    Years since quitting: 30.2  . Smokeless tobacco: Never Used  Substance Use Topics  . Alcohol use: Yes    Comment: none recently; usually with buddies, maybe total of 12 since COVID started  . Drug use: Never    Types: Marijuana    Comment: quit about 6-7 years ago      Allergies   Patient has no known allergies.   Review of Systems Review of Systems Per HPI Physical Exam Triage Vital Signs ED Triage Vitals  Enc Vitals Group     BP 06/27/20 0950 (!) 156/93     Pulse Rate 06/27/20 0950 (!) 107     Resp 06/27/20 0950 17     Temp 06/27/20 0950 98.6 F (37 C)     Temp Source 06/27/20 0950 Oral     SpO2 06/27/20 0950 98 %     Weight --      Height --      Head Circumference --      Peak Flow --      Pain Score 06/27/20 0948 3     Pain Loc --      Pain Edu? --      Excl. in GC? --    No data found.  Updated Vital Signs BP (!) 156/93 (BP Location: Left Arm)   Pulse (!) 107   Temp 98.6 F (37 C) (Oral)   Resp 17   SpO2 98%   Visual Acuity Right Eye Distance:   Left Eye Distance:   Bilateral Distance:    Right Eye Near:   Left Eye Near:    Bilateral Near:     Physical Exam Vitals and nursing note reviewed.  Constitutional:      Appearance: Normal appearance.  HENT:     Head: Atraumatic.     Nose: Nose normal.     Mouth/Throat:     Mouth: Mucous membranes are moist.     Pharynx: Oropharynx is clear. No posterior oropharyngeal erythema.  Eyes:     Extraocular Movements: Extraocular movements intact.     Conjunctiva/sclera: Conjunctivae normal.  Cardiovascular:     Rate and Rhythm: Normal rate and regular rhythm.  Pulmonary:     Effort: Pulmonary effort is normal.     Breath sounds: Normal breath sounds.  Musculoskeletal:        General: Swelling and tenderness present. No deformity. Normal range of motion.     Cervical back: Normal range of motion and neck supple.     Comments: Erythema and edema with significant tenderness palpation right medial sole foot with overlying scabbed ulcerated area where he has been taking rash  Skin:    General: Skin is warm and dry.     Findings: Rash present.     Comments: Diffuse nonblanchable erythematous papular rash mid thigh down to foot bilaterally, nontender to palpation,  nondraining  Neurological:     General: No focal deficit present.     Mental Status: He is oriented to person, place, and time.  Psychiatric:        Mood and Affect: Mood normal.          Thought Content: Thought content normal.        Judgment: Judgment normal.      UC Treatments / Results  Labs (all labs ordered are listed, but only abnormal results are displayed) Labs Reviewed - No data to display  EKG   Radiology No results found.  Procedures Procedures (including critical care time)  Medications Ordered in UC Medications - No data to display  Initial Impression / Assessment and Plan / UC Course  I have reviewed the triage vital signs and the nursing notes.  Pertinent labs & imaging results that were available during my care of the patient were reviewed by me and considered in my medical decision making (see chart for details).     Unclear if vasculitis versus new onset psoriasis versus a reaction to something he came in contact with.  Will treat with only 1 clobetasol ointment, avoid scented products or other possible triggers.  We will also start doxycycline as it appears that the portion on his right foot is becoming infected.  Given his history of diabetes and the signs of cellulitis do want to be aggressive with this.  Over-the-counter pain relievers, elevation, ice, Epsom salt soaks.  Close PCP follow-up for recheck next week.  He also will need to get refills on his blood pressure and diabetes medications as he is currently out.  He states he will do this at his PCP next week.  Final Clinical Impressions(s) / UC Diagnoses   Final diagnoses:  Rash  Cellulitis of right lower extremity  Essential hypertension     Discharge Instructions     Follow up with your Primary Care provider as soon as you are able to    ED Prescriptions    Medication Sig Dispense Auth. Provider   nystatin cream (MYCOSTATIN)  (Status: Discontinued) Apply to affected area 2 times  daily 30 g Smith, Fowler R, NP   doxycycline (VIBRA-TABS) 100 MG tablet Take 1 tablet (100 mg total) by mouth 2 (two) times daily. 14 tablet Lane, Rachel Elizabeth, PA-C   clobetasol ointment (TEMOVATE) 0.05 % Apply 1 application topically 2 (two) times daily. 90 g Lane, Rachel Elizabeth, PA-C     PDMP not reviewed this encounter.   Lane, Rachel Elizabeth, PA-C 06/30/20 1003    Lane, Rachel Elizabeth, PA-C 06/30/20 1008  

## 2020-07-07 ENCOUNTER — Other Ambulatory Visit: Payer: Self-pay

## 2020-07-07 ENCOUNTER — Observation Stay (HOSPITAL_COMMUNITY)
Admission: EM | Admit: 2020-07-07 | Discharge: 2020-07-08 | Disposition: A | Discharge status: Home / Self Care | Payer: Self-pay | Attending: Internal Medicine | Admitting: Internal Medicine

## 2020-07-07 ENCOUNTER — Encounter (HOSPITAL_COMMUNITY): Payer: Self-pay

## 2020-07-07 DIAGNOSIS — R21 Rash and other nonspecific skin eruption: Secondary | ICD-10-CM

## 2020-07-07 DIAGNOSIS — Z79899 Other long term (current) drug therapy: Secondary | ICD-10-CM | POA: Insufficient documentation

## 2020-07-07 DIAGNOSIS — I10 Essential (primary) hypertension: Secondary | ICD-10-CM | POA: Insufficient documentation

## 2020-07-07 DIAGNOSIS — I776 Arteritis, unspecified: Principal | ICD-10-CM | POA: Insufficient documentation

## 2020-07-07 DIAGNOSIS — Z794 Long term (current) use of insulin: Secondary | ICD-10-CM | POA: Insufficient documentation

## 2020-07-07 DIAGNOSIS — Z20822 Contact with and (suspected) exposure to covid-19: Secondary | ICD-10-CM | POA: Insufficient documentation

## 2020-07-07 DIAGNOSIS — Z7984 Long term (current) use of oral hypoglycemic drugs: Secondary | ICD-10-CM | POA: Insufficient documentation

## 2020-07-07 DIAGNOSIS — Z87891 Personal history of nicotine dependence: Secondary | ICD-10-CM | POA: Insufficient documentation

## 2020-07-07 DIAGNOSIS — E119 Type 2 diabetes mellitus without complications: Secondary | ICD-10-CM | POA: Insufficient documentation

## 2020-07-07 LAB — BASIC METABOLIC PANEL
Anion gap: 11 (ref 5–15)
BUN: 13 mg/dL (ref 6–20)
CO2: 23 mmol/L (ref 22–32)
Calcium: 8.8 mg/dL — ABNORMAL LOW (ref 8.9–10.3)
Chloride: 96 mmol/L — ABNORMAL LOW (ref 98–111)
Creatinine, Ser: 0.72 mg/dL (ref 0.61–1.24)
GFR, Estimated: >60 mL/min (ref >60)
Glucose, Bld: 326 mg/dL — ABNORMAL HIGH (ref 70–99)
Potassium: 4.3 mmol/L (ref 3.5–5.1)
Sodium: 130 mmol/L — ABNORMAL LOW (ref 135–145)

## 2020-07-07 LAB — COMPREHENSIVE METABOLIC PANEL
ALT: 15 U/L (ref 0–44)
AST: 32 U/L (ref 15–41)
Albumin: 3.3 g/dL — ABNORMAL LOW (ref 3.5–5.0)
Alkaline Phosphatase: 70 U/L (ref 38–126)
Anion gap: 9 (ref 5–15)
BUN: 14 mg/dL (ref 6–20)
CO2: 21 mmol/L — ABNORMAL LOW (ref 22–32)
Calcium: 8.6 mg/dL — ABNORMAL LOW (ref 8.9–10.3)
Chloride: 97 mmol/L — ABNORMAL LOW (ref 98–111)
Creatinine, Ser: 0.74 mg/dL (ref 0.61–1.24)
GFR, Estimated: >60 mL/min (ref >60)
Glucose, Bld: 375 mg/dL — ABNORMAL HIGH (ref 70–99)
Potassium: 5.5 mmol/L — ABNORMAL HIGH (ref 3.5–5.1)
Sodium: 127 mmol/L — ABNORMAL LOW (ref 135–145)
Total Bilirubin: 1.7 mg/dL — ABNORMAL HIGH (ref 0.3–1.2)
Total Protein: 5.8 g/dL — ABNORMAL LOW (ref 6.5–8.1)

## 2020-07-07 LAB — CBC WITH DIFFERENTIAL/PLATELET
Abs Immature Granulocytes: 0.05 10*3/uL (ref 0.00–0.07)
Basophils Absolute: 0 10*3/uL (ref 0.0–0.1)
Basophils Relative: 0 %
Eosinophils Absolute: 0 10*3/uL (ref 0.0–0.5)
Eosinophils Relative: 0 %
HCT: 40.9 % (ref 39.0–52.0)
Hemoglobin: 14.2 g/dL (ref 13.0–17.0)
Immature Granulocytes: 1 %
Lymphocytes Relative: 15 %
Lymphs Abs: 1.2 10*3/uL (ref 0.7–4.0)
MCH: 29.9 pg (ref 26.0–34.0)
MCHC: 34.7 g/dL (ref 30.0–36.0)
MCV: 86.1 fL (ref 80.0–100.0)
Monocytes Absolute: 0.7 10*3/uL (ref 0.1–1.0)
Monocytes Relative: 9 %
Neutro Abs: 6.3 10*3/uL (ref 1.7–7.7)
Neutrophils Relative %: 75 %
Platelets: 289 10*3/uL (ref 150–400)
RBC: 4.75 MIL/uL (ref 4.22–5.81)
RDW: 13.4 % (ref 11.5–15.5)
WBC: 8.4 10*3/uL (ref 4.0–10.5)
nRBC: 0 % (ref 0.0–0.2)

## 2020-07-07 LAB — URINALYSIS, COMPLETE (UACMP) WITH MICROSCOPIC
Bacteria, UA: NONE SEEN
Bilirubin Urine: NEGATIVE
Glucose, UA: >=500 mg/dL — AB
Hgb urine dipstick: NEGATIVE
Ketones, ur: NEGATIVE mg/dL
Leukocytes,Ua: NEGATIVE
Nitrite: NEGATIVE
Protein, ur: NEGATIVE mg/dL
Specific Gravity, Urine: 1.027 (ref 1.005–1.030)
pH: 6 (ref 5.0–8.0)

## 2020-07-07 LAB — HEPATITIS C ANTIBODY: HCV Ab: NONREACTIVE

## 2020-07-07 LAB — LACTIC ACID, PLASMA: Lactic Acid, Venous: 1.4 mmol/L (ref 0.5–1.9)

## 2020-07-07 LAB — HEPATITIS B SURFACE ANTIGEN: Hepatitis B Surface Ag: NONREACTIVE

## 2020-07-07 LAB — RESP PANEL BY RT-PCR (FLU A&B, COVID) ARPGX2
Influenza A by PCR: NEGATIVE
Influenza B by PCR: NEGATIVE
SARS Coronavirus 2 by RT PCR: NEGATIVE

## 2020-07-07 LAB — GLUCOSE, CAPILLARY: Glucose-Capillary: 362 mg/dL — ABNORMAL HIGH (ref 70–99)

## 2020-07-07 LAB — HIV ANTIBODY (ROUTINE TESTING W REFLEX): HIV Screen 4th Generation wRfx: NONREACTIVE

## 2020-07-07 LAB — SEDIMENTATION RATE: Sed Rate: 14 mm/hr (ref 0–16)

## 2020-07-07 LAB — C-REACTIVE PROTEIN: CRP: 1.2 mg/dL — ABNORMAL HIGH (ref <1.0)

## 2020-07-07 MED ORDER — SODIUM CHLORIDE 0.9 % IV BOLUS
1000.0000 mL | Freq: Once | INTRAVENOUS | Status: AC
  Administered 2020-07-07: 1000 mL via INTRAVENOUS

## 2020-07-07 MED ORDER — ACETAMINOPHEN 325 MG PO TABS
650.0000 mg | ORAL_TABLET | Freq: Once | ORAL | Status: AC
  Administered 2020-07-07: 650 mg via ORAL
  Filled 2020-07-07: qty 2

## 2020-07-07 MED ORDER — POLYETHYLENE GLYCOL 3350 17 G PO PACK: 17.0000 g | PACK | Freq: Every day | ORAL | Status: DC | PRN

## 2020-07-07 MED ORDER — ACETAMINOPHEN 325 MG PO TABS: 650.0000 mg | ORAL_TABLET | Freq: Four times a day (QID) | ORAL | Status: DC | PRN

## 2020-07-07 MED ORDER — PREDNISONE 20 MG PO TABS
40.0000 mg | ORAL_TABLET | Freq: Once | ORAL | Status: AC
  Administered 2020-07-07: 40 mg via ORAL
  Filled 2020-07-07: qty 2

## 2020-07-07 MED ORDER — HYDROCODONE-ACETAMINOPHEN 5-325 MG PO TABS
1.0000 | ORAL_TABLET | Freq: Four times a day (QID) | ORAL | Status: DC | PRN
  Administered 2020-07-07: 2 via ORAL
  Filled 2020-07-07: qty 2

## 2020-07-07 MED ORDER — SODIUM CHLORIDE 0.9% FLUSH
3.0000 mL | Freq: Two times a day (BID) | INTRAVENOUS | Status: DC
  Administered 2020-07-07 – 2020-07-08 (×2): 3 mL via INTRAVENOUS

## 2020-07-07 MED ORDER — ACETAMINOPHEN 650 MG RE SUPP: 650.0000 mg | Freq: Four times a day (QID) | RECTAL | Status: DC | PRN

## 2020-07-07 MED ORDER — ADULT MULTIVITAMIN W/MINERALS CH
1.0000 | ORAL_TABLET | Freq: Every day | ORAL | Status: DC
  Administered 2020-07-08: 1 via ORAL
  Filled 2020-07-07: qty 1

## 2020-07-07 MED ORDER — INSULIN ASPART 100 UNIT/ML ~~LOC~~ SOLN
0.0000 [IU] | Freq: Three times a day (TID) | SUBCUTANEOUS | Status: DC
  Administered 2020-07-08 (×2): 8 [IU] via SUBCUTANEOUS

## 2020-07-07 NOTE — ED Triage Notes (Addendum)
Pt c/o generalized rash with petechiae that has been worsening over last 3 weeks. Pt seen at multiple facilities. No relief with ABX and steroids given. Pt now experience thrush since last night. C/o pain in bilateral feet; wound present on right foot.

## 2020-07-07 NOTE — ED Notes (Signed)
Attempted to call report; RN and Consulting civil engineer in pt care at this time. Extension given for callback.

## 2020-07-07 NOTE — H&P (Signed)
Date: 07/07/2020               Patient Name:  Zadyn Yardley MRN: [id number]  DOB: 10/22/1960 Age / Sex: 60 y.o., male   PCP: Pcp, No         Medical Service: Internal Medicine Teaching Service         Attending Physician: Dr. Jimmye Norman, Elaina Pattee, MD    First Contact: Dr. Allyson Sabal Pager: [phone]  Second Contact: Dr. Charleen Kirks Pager: [phone]       After Hours (After 5p/  First Contact Pager: [phone]  weekends / holidays): Second Contact Pager: [phone]   Chief Complaint: Rash  History of Present Illness:   Mr. Hermon Zea is a 60 year old gentleman with a past medical history of type 2 diabetes, hypertension, hyperlipidemia, MRSA bacteremia secondary to prostatitis who presents to the ED with complaints of a rash.  Patient states that 3 weeks ago, he had a severe sinus infection with congestion.  It was severe enough that was causing his nose to bleed when he was blowing his nose.  Due to this, he had taken his mother's leftover Augmentin.  He states he took 3 to 4 tablets as he did not know how much to take.  Several hours later, he noticed 3 spots on his right ankle that were itchy and look like flea bites.  When he arrived home from work that day, he states both his legs bilaterally were completely covered with these red spots.  A few days later, he went to an urgent care where he was diagnosed with cellulitis and given doxycycline and prednisone.  After no resolution, he went to a hospital in [place] where he was told it is likely vasculitis from Augmentin use.  He was instructed to return to the hospital if the rash showed progress.  At one point, he did develop a blood filled bullae on his right ankle.  Mr. Jessop states that his rash began to improve and it had resolved all the way down to his ankles. This morning when he woke up, the rash had returned and spread extensively to his upper thighs, abdomen.  The large bullae that had developed on his right ankle had popped, and  there has been development of new bullae on his left foot. Affected area is not affected by temperature. He states that occasionally the rash is itchy, but the most bothersome sensation is the burning sensation in his bilateral feet that makes it sensitive to the touch at times.  This is new since onset of rash.  He denies any history of STI.  He is sexually active with his partner of 25 years.  He denies any history of IV drug use and no recent illicit drug use of any kind other than marijuana.    No fever, chills, chest pain, SOB, generalized weakness, dysuria, hematuria, diarrhea, melena, hematochezia.   ED course: On arrival to the ED, patient's vitals included blood pressure of 121/86, heart rate of 114 that improved to 98, temperature of 97.4, saturating at 100% on room air.  Initial lab work showed normal hemoglobin, platelets with no leukocytosis.  Initial CMP demonstrated mild hyponatremia of 127 with potassium of 5.5 (sample hemolyzed, repeat was 4.3), CBG of 375, decreased total protein at 5.8, and total bili of 1.7.  ESR of 14 with CRP of 1.2.  [facility] was consulted for admission for possible vasculitis.  Meds:  Current Meds  Medication Sig  . clobetasol ointment (TEMOVATE) 0.05 %  Apply 1 application topically 2 (two) times daily.  Marland Kitchen doxycycline (VIBRA-TABS) 100 MG tablet Take 1 tablet (100 mg total) by mouth 2 (two) times daily.  Marland Kitchen glimepiride (AMARYL) 4 MG tablet Take 4 mg by mouth 2 (two) times daily.  Marland Kitchen HYDROcodone-acetaminophen (NORCO/VICODIN) 5-325 MG tablet Take 1 tablet by mouth every 6 (six) hours as needed for moderate pain.  Marland Kitchen lisinopril (ZESTRIL) 20 MG tablet Take 20 mg by mouth daily.  . pioglitazone (ACTOS) 30 MG tablet Take 30 mg by mouth daily.  . predniSONE (DELTASONE) 20 MG tablet Take 40 mg by mouth daily.   Allergies: Allergies as of 07/07/2020  . (No Known Allergies)   Past Medical History:  Diagnosis Date  . Diabetes mellitus without complication ([place])   .  Hypercholesteremia   . Hypertension   . Knee pain 10/09/2018  . MRSA bacteremia 10/09/2018   Family History:  History reviewed. No pertinent family history.  Social History:  Patient lives with his significant other of 25 years, Magda Paganini. Denies any history of tobacco use. Social alcohol use, 1-2 times per month Cocaine use many years ago.  Never used IV drugs.  Smokes marijuana intermittently. 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  Review of Systems: A complete ROS was negative except as per HPI.   Physical Exam: Blood pressure 134/84, pulse 87, temperature (!) 97.4 F (36.3 C), temperature source Oral, resp. rate 18, SpO2 98 %.  Physical Exam Vitals and nursing note reviewed.  Constitutional:      General: He is not in acute distress.    Appearance: He is normal weight. He is not ill-appearing or toxic-appearing.  HENT:     Head: Normocephalic and atraumatic.     Mouth/Throat:     Mouth: Mucous membranes are moist.     Pharynx: Oropharynx is clear. No oropharyngeal exudate or posterior oropharyngeal erythema.  Eyes:     Extraocular Movements: Extraocular movements intact.     Conjunctiva/sclera: Conjunctivae normal.     Pupils: Pupils are equal, round, and reactive to light.  Pulmonary:     Effort: Pulmonary effort is normal. No respiratory distress.  Abdominal:     General: There is no distension.     Palpations: Abdomen is soft.     Tenderness: There is no abdominal tenderness.  Skin:    General: Skin is warm and dry.     Findings: Petechiae and rash present. Rash is purpuric and pustular.     Comments:  Diffuse purpura that is palpable in some regions that covers patient's bilateral feet, up to bilateral thighs, central and lateral abdomen, and some around the buttocks.  Nonblanchable. Patient's back is diffusely erythematous, blanchable. Please see media tab and below for pictures.  Neurological:     General: No focal deficit present.     Mental Status: He is alert and  oriented to person, place, and time. Mental status is at baseline.  Psychiatric:        Mood and Affect: Mood normal.        Behavior: Behavior normal.        Thought Content: Thought content normal.        Judgment: Judgment normal.    Left lateral abdomen:   Right foot:   Left upper thigh:   Left lower leg:    EKG: personally reviewed my interpretation is: Sinus rhythm with mild tachycardia, left axis deviation.  No signs of acute ischemia.  Assessment & Plan by Problem: Active Problems:   Vasculitis ([place])  Mr. Giovanne Nickolson is a 60 year old gentleman with a past medical history of type 2 diabetes, hypertension, hyperlipidemia who presented to the ED with complaints of rash is currently admitted for vasculitis work-up.  # Vasculitis  3-week history of vasculitic rash with palpable purpura and blood-filled bullae with acute onset neuropathy of bilateral lower extremities in the setting of recent Augmentin use.  No history of autoimmune disorders, HIV, hepatitis C. It is concerning that patient's rash improved and then returned more aggressively today.  Primary differential includes leukocytoclastic vasculitis in the setting of Augmentin use, however it is unusual for it to return without a repeat exposure.  Due to this, will start broad work-up for other causes of vasculitis at this time.  Other than neuropathy, no others signs of systemic involvement.  UA does not demonstrate any renal involvement.  - Plan for punch-biopsy in the AM - HIV, Hep C, Hep B, ANA w/ reflex, ANCA pending  # Type 2 Diabetes Mellitus Last A1c available from July 2020 that was markedly elevated at 12%.  Patient states he is on glimepiride and Actos.  - A1c pending - SSI (moderate)   # HTN Patient takes lisinopril at home.  Normotensive examination today.  - Restart home lisinopril tomorrow morning   Diet: Carb-Modified VTE: None IVF: None,None Code: Full  Prior to Admission Living  Arrangement: Home, living with partner Anticipated Discharge Location: Home Barriers to Discharge: Continued medical evaluation  Dispo: Admit patient to Observation with expected length of stay less than 2 midnights.  Signed: Dr. Jose Persia Internal Medicine PGY-2 Pager: [phone] After 5pm on weekdays and 1pm on weekends: On Call pager [phone]  07/07/2020, 6:01 PM

## 2020-07-07 NOTE — Progress Notes (Signed)
New Admission Note:  Arrival Method: Stretcher Mental Orientation: Alert and oriented x 4 Telemetry: N/A Assessment: Completed Skin: Warm and dry. Rash all over upper and lower torso.  IV: NSL Pain: 10/10 legs Tubes: N/A Safety Measures: Safety Fall Prevention Plan initiated.  Admission: Completed 5 M  Orientation: Patient has been orientated to the room, unit and the staff. Welcome booklet given.  Family: Wife  Orders have been reviewed and implemented. Will continue to monitor the patient. Call light has been placed within reach and bed alarm has been activated.   Guilford Shi BSN, RN  Phone Number: [phone]

## 2020-07-07 NOTE — ED Notes (Signed)
Attempted to call report; RN and Charge RN unavailable at this time. 

## 2020-07-07 NOTE — ED Notes (Addendum)
Repeat CMP sent down to lab. Lab called to add BMP to this blood draw.

## 2020-07-07 NOTE — ED Provider Notes (Signed)
[facility] EMERGENCY DEPARTMENT Provider Note   CSN: [id number] Arrival date & time: 07/07/20  6270     History Chief Complaint  Patient presents with  . Rash    Carl Baker is a 60 y.o. male with past medical history of diabetes and hypertension that presents to the emergency department today for a rash.  Patient states that he is had a rash for 3 weeks, states that it all started after he started taking some Augmentin from his mom for sinus infection that he treated himself with.  Patient went to urgent care for this rash and has been treated with doxycycline and clobetasol.  Rash is throughout his body, is not painful but it is itchy and he started having a fluid-filled bulla on his foot which is painful.  Patient states that bulla popped last night, however states that the rash is spreading and it is now going up to his upper abdomen.  Started on his lower extremities and is moving upward.  Also experiencing thrush on his tongue and a few spots on his tongue.  Denies any vision changes or respiratory distress or chest pain.  Denies any dermatological issues.  Denies any soaps lotions or detergents that are new.   Patient states that he went to hospital in [place] a week ago for the same thing, states that they ran a bunch of test on him and gave him steroids and another cycle of doxycycline which has not been working.  Denies any fevers, chills body aches difficulty breathing throat swelling.  Denies any drainage from any of these areas.  Denies any recent travel, hiking or camping or tick bites.  Rashes not on palms or soles.  Denies any myalgias.    HPI     Past Medical History:  Diagnosis Date  . Diabetes mellitus without complication ([place])   . Hypercholesteremia   . Hypertension   . Knee pain 10/09/2018  . MRSA bacteremia 10/09/2018    Patient Active Problem List   Diagnosis Date Noted  . Dehydration with hyponatremia 10/08/2018  . Hypertension   .  Hypercholesteremia   . Diabetes mellitus without complication [facility])     Past Surgical History:  Procedure Laterality Date  . APPENDECTOMY    . BUBBLE STUDY  10/12/2018   Procedure: BUBBLE STUDY;  Surgeon: Elouise Munroe, MD;  Location: [facility];  Service: Cardiology;;  . TEE WITHOUT CARDIOVERSION N/A 10/12/2018   Procedure: TRANSESOPHAGEAL ECHOCARDIOGRAM (TEE);  Surgeon: Elouise Munroe, MD;  Location: [facility];  Service: Cardiology;  Laterality: N/A;  . WRIST FRACTURE SURGERY         History reviewed. No pertinent family history.  Social History   Tobacco Use  . Smoking status: Former Smoker    Quit date: 1992    Years since quitting: 30.3  . Smokeless tobacco: Never Used  Substance Use Topics  . Alcohol use: Yes    Comment: none recently; usually with buddies, maybe total of 12 since COVID started  . Drug use: Never    Types: Marijuana    Comment: quit about 6-7 years ago    Home Medications Prior to Admission medications   Medication Sig Start Date End Date Taking? Authorizing Provider  clobetasol ointment (TEMOVATE) 3.50 % Apply 1 application topically 2 (two) times daily. 06/27/20  Yes Volney American, PA-C  doxycycline (VIBRA-TABS) 100 MG tablet Take 1 tablet (100 mg total) by mouth 2 (two) times daily. 06/27/20  Yes Volney American,  PA-C  glimepiride (AMARYL) 4 MG tablet Take 4 mg by mouth 2 (two) times daily. 10/04/18  Yes [provider]  HYDROcodone-acetaminophen (NORCO/VICODIN) 5-325 MG tablet Take 1 tablet by mouth every 6 (six) hours as needed for moderate pain.   Yes [provider]  lisinopril (ZESTRIL) 20 MG tablet Take 20 mg by mouth daily. 10/04/18  Yes [provider]  pioglitazone (ACTOS) 30 MG tablet Take 30 mg by mouth daily. 10/04/18  Yes [provider]  predniSONE (DELTASONE) 20 MG tablet Take 40 mg by mouth daily.   Yes [provider]  blood glucose meter kit and supplies KIT  Dispense based on patient and insurance preference. Use up to four times daily as directed. (FOR ICD-9 250.00, 250.01). 10/13/18   [name], MD  insulin aspart protamine - aspart (NOVOLOG MIX 70/30 FLEXPEN) (70-30) 100 UNIT/ML FlexPen Inject 0.2 mLs (20 Units total) into the skin 2 (two) times daily. Patient not taking: Reported on 07/07/2020 10/13/18   [name], MD  Insulin Pen Needle 32G X 4 MM MISC 1 each by Does not apply route 2 (two) times a day. 10/13/18   [name], MD  Multiple Vitamins-Minerals (CENTRUM SILVER ADULT 50+) TABS Take 1 tablet by mouth daily.    [provider]    Allergies    Patient has no known allergies.  Review of Systems   Review of Systems  Constitutional: Negative for chills, diaphoresis, fatigue and fever.  HENT: Negative for congestion, sore throat and trouble swallowing.   Eyes: Negative for pain and visual disturbance.  Respiratory: Negative for cough, shortness of breath and wheezing.   Cardiovascular: Negative for chest pain, palpitations and leg swelling.  Gastrointestinal: Negative for abdominal distention, abdominal pain, diarrhea, nausea and vomiting.  Genitourinary: Negative for difficulty urinating.  Musculoskeletal: Negative for back pain, neck pain and neck stiffness.  Skin: Positive for rash. Negative for pallor.  Neurological: Negative for dizziness, speech difficulty, weakness and headaches.  Psychiatric/Behavioral: Negative for confusion.    Physical Exam Updated Vital Signs BP 130/86   Pulse 98   Temp (!) 97.4 F (36.3 C) (Oral)   Resp (!) 22   SpO2 97%   Physical Exam Skin:    Comments: Petechial rash over body, more concentrated to lower extremities.  Scattered through thighs and abdomen.  Not much on back.  None on torso or face.  Does have some petechiae on tongue with thrush on tongue.  Hemorrhagic bulla on right ankle.             ED Results / Procedures / Treatments   Labs (all labs  ordered are listed, but only abnormal results are displayed) Labs Reviewed  COMPREHENSIVE METABOLIC PANEL - Abnormal; Notable for the following components:      Result Value   Sodium 127 (*)    Potassium 5.5 (*)    Chloride 97 (*)    CO2 21 (*)    Glucose, Bld 375 (*)    Calcium 8.6 (*)    Total Protein 5.8 (*)    Albumin 3.3 (*)    Total Bilirubin 1.7 (*)    All other components within normal limits  C-REACTIVE PROTEIN - Abnormal; Notable for the following components:   CRP 1.2 (*)    All other components within normal limits  BASIC METABOLIC PANEL - Abnormal; Notable for the following components:   Sodium 130 (*)    Chloride 96 (*)    Glucose, Bld 326 (*)  Calcium 8.8 (*)    All other components within normal limits  RESP PANEL BY RT-PCR (FLU A&B, COVID) ARPGX2  CBC WITH DIFFERENTIAL/PLATELET  SEDIMENTATION RATE  LACTIC ACID, PLASMA    EKG EKG Interpretation  Date/Time:  Saturday July 07 2020 10:32:49 EDT Ventricular Rate:  103 PR Interval:  162 QRS Duration: 96 QT Interval:  340 QTC Calculation: 445 R Axis:   -13 Text Interpretation: Sinus tachycardia Baseline wander in lead(s) V4 Confirmed by Quintella Reichert [phone]) on 07/07/2020 12:14:23 PM   Radiology No results found.  Procedures Procedures   Medications Ordered in ED Medications  sodium chloride 0.9 % bolus 1,000 mL (0 mLs Intravenous Stopped 07/07/20 1448)  acetaminophen (TYLENOL) tablet 650 mg (650 mg Oral Given 07/07/20 1556)    ED Course  I have reviewed the triage vital signs and the nursing notes.  Pertinent labs & imaging results that were available during my care of the patient were reviewed by me and considered in my medical decision making (see chart for details).    MDM Rules/Calculators/A&P                         Patient appears well, no respiratory distress, stable vitals however I do think patient has a rash suggestive of vasculitis with hemorrhagic bullae.  Rash could also be  suggestive of drug-induced rash from Augmentin, however does not appear like a drug eruption.  Differential to include to include tickborne illness, patient has been treated twice with doxycycline.  Patient without any chest pain, low suspicion for endocarditis.  Patient is not an IV drug user.  Does not have any fevers.  No neck pain, no concerns for meningococcemia.  ITP is on the differential as well.  Pt care was handed off to  Dr. Ralene Bathe. Complete history and physical and current plan have been communicated.  Please refer to their note for the remainder of ED care and ultimate disposition. Im coming to evaluate for possible admission.   Final Clinical Impression(s) / ED Diagnoses Final diagnoses:  Rash    Rx / DC Orders ED Discharge Orders    None       Alfredia Client, PA-C 07/07/20 1558    Lacretia Leigh, MD 07/10/20 1128

## 2020-07-08 DIAGNOSIS — E1165 Type 2 diabetes mellitus with hyperglycemia: Secondary | ICD-10-CM

## 2020-07-08 DIAGNOSIS — L959 Vasculitis limited to the skin, unspecified: Secondary | ICD-10-CM

## 2020-07-08 LAB — CBC
HCT: 42 % (ref 39.0–52.0)
Hemoglobin: 14.4 g/dL (ref 13.0–17.0)
MCH: 29 pg (ref 26.0–34.0)
MCHC: 34.3 g/dL (ref 30.0–36.0)
MCV: 84.5 fL (ref 80.0–100.0)
Platelets: 305 10*3/uL (ref 150–400)
RBC: 4.97 MIL/uL (ref 4.22–5.81)
RDW: 13.2 % (ref 11.5–15.5)
WBC: 10 10*3/uL (ref 4.0–10.5)
nRBC: 0 % (ref 0.0–0.2)

## 2020-07-08 LAB — HEPATITIS B CORE ANTIBODY, TOTAL: Hep B Core Total Ab: REACTIVE — AB

## 2020-07-08 LAB — GLUCOSE, CAPILLARY
Glucose-Capillary: 289 mg/dL — ABNORMAL HIGH (ref 70–99)
Glucose-Capillary: 276 mg/dL — ABNORMAL HIGH (ref 70–99)

## 2020-07-08 MED ORDER — HYDROCODONE-ACETAMINOPHEN 5-325 MG PO TABS: 1.0000 | ORAL_TABLET | Freq: Four times a day (QID) | ORAL | 0 refills | Status: AC | PRN

## 2020-07-08 MED ORDER — INSULIN GLARGINE 100 UNIT/ML SOLOSTAR PEN: 10.0000 [IU] | PEN_INJECTOR | Freq: Every day | SUBCUTANEOUS | 0 refills | Status: DC

## 2020-07-08 MED ORDER — LISINOPRIL 20 MG PO TABS
20.0000 mg | ORAL_TABLET | Freq: Every day | ORAL | Status: DC
  Administered 2020-07-08: 20 mg via ORAL
  Filled 2020-07-08: qty 1

## 2020-07-08 MED ORDER — PREDNISONE 20 MG PO TABS: ORAL_TABLET | ORAL | 0 refills | Status: AC

## 2020-07-08 NOTE — Discharge Summary (Signed)
Name: Carl Baker MRN: [id number] DOB: Aug 25, 1960 60 y.o. PCP: Pcp, No  Date of Admission: 07/07/2020  9:12 AM Date of Discharge: 07/08/2020 Attending Physician: Angelica Pou, MD  Discharge Diagnosis: 1. Vasculitis  2. Type 2 Diabetes Mellitus   Discharge Medications: Allergies as of 07/08/2020   No Known Allergies     Medication List    STOP taking these medications   doxycycline 100 MG tablet Commonly known as: VIBRA-TABS   NovoLOG Mix 70/30 FlexPen (70-30) 100 UNIT/ML FlexPen Generic drug: insulin aspart protamine - aspart     TAKE these medications   blood glucose meter kit and supplies Kit Dispense based on patient and insurance preference. Use up to four times daily as directed. (FOR ICD-9 250.00, 250.01).   Centrum Silver Adult 50+ Tabs Take 1 tablet by mouth daily.   clobetasol ointment 0.05 % Commonly known as: TEMOVATE Apply 1 application topically 2 (two) times daily.   glimepiride 4 MG tablet Commonly known as: AMARYL Take 4 mg by mouth 2 (two) times daily.   HYDROcodone-acetaminophen 5-325 MG tablet Commonly known as: NORCO/VICODIN Take 1 tablet by mouth every 6 (six) hours as needed for moderate pain.   insulin glargine 100 UNIT/ML Solostar Pen Commonly known as: LANTUS Inject 10 Units into the skin daily.   Insulin Pen Needle 32G X 4 MM Misc 1 each by Does not apply route 2 (two) times a day.   lisinopril 20 MG tablet Commonly known as: ZESTRIL Take 20 mg by mouth daily.   pioglitazone 30 MG tablet Commonly known as: ACTOS Take 30 mg by mouth daily.   predniSONE 20 MG tablet Commonly known as: DELTASONE Take 3 tablets (60 mg total) by mouth daily with breakfast for 7 days, THEN 2 tablets (40 mg total) daily with breakfast for 7 days. Start taking on: July 09, 2020 What changed: See the new instructions.       Disposition and follow-up:   [name] was discharged from [facility] in Good  condition.  At the hospital follow up visit please address:  1.     Please follow up pathology results, ANCA panel, and ANA  Please address continuing Prednisone taper  Please address diabetes regimen as A1c is markedly elevated  Patient did hit his right fifth toe prior to arrival and suspects he may have broken it.  Recommend follow-up to ensure resolution of pain and swelling.  2.  Labs / imaging needed at time of follow-up: None  3.  Pending labs/ test needing follow-up: Surgical pathology, ANCA, ANA w/ reflex  Follow-up Appointments: Patient to follow-up at [facility] in 10 to 14 days.  Hospital Course by problem list: 1.  Vasculitis: 3-week history of vasculitic rash with palpable purpura and blood-filled bullae with acute onset neuropathy of bilateral lower extremities in the setting of recent Augmentin use.  No known history of autoimmune disorders, HIV, hepatitis C. It is concerning that patient's rash improved and then returned more aggressively. He was still on his prednisone course when the rash improved and then returned. Other than neuropathy, no others signs of systemic involvement. UA/BMP does not demonstrate any renal involvement.    Patient is negative for HIV, hepatitis C, hepatitis B.  No evidence of anemia or thrombocytopenia.  Suspect this is a small vessel vasculitis, although the blood-filled bullae is a bit atypical.  We will send the biopsy out for diagnosis finalization.  Plan to start additional prednisone taper at higher dose and  very slow taper down over 3 to 4 weeks.  2.  Type 2 diabetes, uncontrolled: Patient states he has at least several year history of diabetes.  Last A1c in the system was from July 2020 that was markedly elevated around 12.8%.  A1c was repeated during his admission and returned at 12.8%.  Patient's current regimen is glimepiride and Actos, presumably due to his lack of insurance and difficulty affording additional medication.  And that we are  starting higher dose prednisone, we recommended patient start on a long-acting insulin.  Patient is in agreement.  Lantus initially sent to the pharmacy, but patient unable to afford.  Patient is establishing with the IMTS clinic, so Levemir prescription sent to the [facility] outpatient pharmacy under the IM program $4 dollar list.  Pertinent Labs, Studies, and Procedures:  CBC Latest Ref Rng & Units 07/08/2020 07/07/2020 10/13/2018  WBC 4.0 - 10.5 K/uL 10.0 8.4 14.6(H)  Hemoglobin 13.0 - 17.0 g/dL 14.4 14.2 11.6(L)  Hematocrit 39.0 - 52.0 % 42.0 40.9 34.4(L)  Platelets 150 - 400 K/uL 305 289 347   CMP Latest Ref Rng & Units 07/07/2020 07/07/2020 10/13/2018  Glucose 70 - 99 mg/dL 326(H) 375(H) 141(H)  BUN 6 - 20 mg/dL _0 Creatinine 0.61 - 1.24 mg/dL 0.72 0.74 0.71  Sodium 135 - 145 mmol/L 130(L) 127(L) 134(L)  Potassium 3.5 - 5.1 mmol/L 4.3 5.5(H) 3.6  Chloride 98 - 111 mmol/L 96(L) 97(L) 103  CO2 22 - 32 mmol/L 23 21(L) 23  Calcium 8.9 - 10.3 mg/dL 8.8(L) 8.6(L) 7.8(L)  Total Protein 6.5 - 8.1 g/dL - 5.8(L) -  Total Bilirubin 0.3 - 1.2 mg/dL - 1.7(H) -  Alkaline Phos 38 - 126 U/L - 70 -  AST 15 - 41 U/L - 32 -  ALT 0 - 44 U/L - 15 -   Discharge Instructions: Discharge Instructions    Call MD for:  persistant dizziness or light-headedness   Complete by: As directed    Call MD for:  persistant nausea and vomiting   Complete by: As directed    Call MD for:  redness, tenderness, or signs of infection (pain, swelling, redness, odor or green/yellow discharge around incision site)   Complete by: As directed    Call MD for:  temperature >100.4   Complete by: As directed    Discharge instructions   Complete by: As directed    Mr. Carl, Baker were admitted to the hospital for observation due to the rash on your leg that is concerning for vasculitis.  We have started the work-up and are awaiting results.  While you were here, you also had a biopsy of one of the spots.  I will call you  with the results as soon as I receive them.  To help with the rash, we are sending you home with prednisone.  Please take 3 tablets every morning for 7 days, then decrease to 2 tablets every morning for 7 days.  When you have your hospital follow-up, we will determine how to continue the taper.  We discussed following up with our clinic here in the hospital in the basement floor.  I will have the clinic call you to make an appointment within the next 10 to 14 days.  Given that we are sending you home on steroids, I would like to start you on nighttime insulin.  Please use 10 units before bedtime.  Begin checking your sugar in the morning when you first wake up  before you have anything to eat.   It was a pleasure meeting you and we look forward to seeing you in our clinic.   - Dr. Charleen Kirks (Dr. B)   Increase activity slowly   Complete by: As directed    No wound care   Complete by: As directed       Signed: Dr. Jose Persia Internal Medicine PGY-2  Pager: [phone] After 5pm on weekdays and 1pm on weekends: On Call pager [phone]  07/08/2020, 3:36 PM

## 2020-07-08 NOTE — Progress Notes (Signed)
Subjective:   Carl Baker denies any acute complaints at this time.  He states the rash has not spread any more.  He did have some neuropathy last night but felt that the Norco controlled that well.   We discussed plan for biopsy today and continuation of steroids on discharge.  We discussed what results have returned in which we are still waiting on.  Patient expressed understanding.  Objective:  Vital signs in last 24 hours: Vitals:   07/07/20 1900 07/07/20 1914 07/07/20 1919 07/08/20 0516  BP: 135/80 (!) 164/42  (!) 127/92  Pulse: 99 (!) 101  99  Resp: (!) 23 20  20   Temp: 99 F (37.2 C) (!) 97.3 F (36.3 C)  97.9 F (36.6 C)  TempSrc:  Oral  Oral  SpO2: 93%   97%  Weight:   103.5 kg   Height:   6\' 2"  (1.88 m)    Physical Exam Constitutional:      Appearance: He is normal weight.  HENT:     Head: Normocephalic and atraumatic.     Mouth/Throat:     Mouth: Mucous membranes are moist.     Pharynx: Oropharynx is clear.  Cardiovascular:     Rate and Rhythm: Normal rate and regular rhythm.     Heart sounds: No murmur heard.   Pulmonary:     Effort: Pulmonary effort is normal. No respiratory distress.     Breath sounds: No wheezing, rhonchi or rales.  Musculoskeletal:     Right lower leg: No edema.     Left lower leg: No edema.  Skin:    Comments: Vasculitis rash unchanged from prior exam, please see media tab for images.  Neurological:     General: No focal deficit present.     Mental Status: He is alert and oriented to person, place, and time. Mental status is at baseline.  Psychiatric:        Mood and Affect: Mood normal.        Behavior: Behavior normal.        Thought Content: Thought content normal.        Judgment: Judgment normal.    Assessment/Plan:  Active Problems:   Vasculitis [facility])  Carl Baker is a 60 year old gentleman with a past medical history of type 2 diabetes, hypertension, hyperlipidemia who presented to the ED with complaints of  rash is currently admitted for vasculitis work-up.  # Vasculitis  3-week history of vasculitic rash with palpable purpura and blood-filled bullae with acute onset neuropathy of bilateral lower extremities in the setting of recent Augmentin use.  Other than neuropathy, no others signs of systemic involvement.  UA/BMP does not demonstrate any renal involvement.  Suspect this is a small vessel vasculitis, although the blood-filled bullae is a bit atypical.  We will send the biopsy out for diagnosis finalization and plan to continue patient on prednisone for now.  -Biopsy results pending -ANA w/ reflex, ANCA pending -Prednisone 60 mg for 1 week, followed by prednisone 40 mg for 1 week.  Taper to be determined at that time pending diagnosis  # Type 2 Diabetes Mellitus Last A1c available from July 2020 that was markedly elevated at 12%.  Patient states he is on glimepiride and Actos.  -A1c pending -SSI (moderate) -Resume home regimen on discharge with close follow-up with primary care -We will discuss with patient starting Lantus at bedtime while he is on prednisone given his uncontrolled diabetes with CBGs between 200-300 during this admission  #  HTN -Restart home lisinopril  Diet: Carb-Modified VTE: None IVF: None,None Code: Full  Prior to Admission Living Arrangement: Home, living with partner Anticipated Discharge Location: Home Barriers to Discharge: Discharge planned today  Dr. Verdene Lennert Internal Medicine PGY-2  Pager: [phone] After 5pm on weekdays and 1pm on weekends: On Call pager [phone]  07/08/2020, 7:02 AM

## 2020-07-08 NOTE — Progress Notes (Signed)
Patient refused 5pm CBG check, RN educated patient and will continue to monitor this patient until discharge.

## 2020-07-08 NOTE — Procedures (Signed)
After informed written consent was obtained, using Betadine for cleansing and 1% Lidocaine without epinephrine for anesthetic, with sterile technique a 4 mm punch biopsy was used to obtain a biopsy specimen of the lesion. Hemostasis was obtained by pressure and wound was bandaged. Antibiotic dressing is applied, and wound care instructions provided. Be alert for any signs of cutaneous infection. The specimen is labeled and sent to pathology for evaluation. The procedure was well tolerated without complications.

## 2020-07-08 NOTE — Progress Notes (Signed)
DISCHARGE NOTE HOME  Carl Baker to be discharged Home per MD order. Discussed prescriptions and follow up appointments with the patient. Prescriptions given to patient; medication list explained in detail. Patient verbalized understanding.  Skin clean, dry and intact without evidence of skin break down, no evidence of skin tears noted. IV catheter discontinued intact. Site without signs and symptoms of complications. Dressing and pressure applied. Pt denies pain at the site currently. No complaints noted.  Patient free of lines, drains, and wounds.   An After Visit Summary (AVS) was printed and given to the patient. Patient escorted via wheelchair, and discharged home via private auto.  Pat Patrick, RN

## 2020-07-08 NOTE — TOC Initial Note (Signed)
Transition of Care [facility]) - Initial/Assessment Note    Patient Details  Name: Carl Baker MRN: [id number] Date of Birth: [birth year]  [facility] CM/SW Contact:    Bess Kinds, RN Phone Number: [phone] 07/08/2020, 4:50 PM  Clinical Narrative:                  Spoke with patient at the bedside to discuss transition planning. Patient stated that Lantus is a new medication for him and that he had previously been taking oral hypoglycemics. He stated that his PCP is Dr. Evelene Croon, because she only charges $60 for an appointment. Patient confirmed that he does not have insurance. Discussed high cost of Lantus. Discussed follow up with [facility] pharmacy for assistance with patient assistance program. Patient stated that he would call a ride when ready for DC.   Notified MD of patient concerns for cost of Lantus. MD to follow up. Patient will be establishing care with Internal Medicine, and will be set up with [facility] outpatient pharmacy.   No further TOC needs identified.  Expected Discharge Plan: Home/Self Care Barriers to Discharge: No Barriers Identified   Patient Goals and CMS Choice Patient states their goals for this hospitalization and ongoing recovery are:: return home CMS Medicare.gov Compare Post Acute Care list provided to:: Patient Choice offered to / list presented to : NA  Expected Discharge Plan and Services Expected Discharge Plan: Home/Self Care   Discharge Planning Services: CM Consult Post Acute Care Choice: NA Living arrangements for the past 2 months: Single Family Home Expected Discharge Date: 07/08/20               DME Arranged: N/A DME Agency: NA       HH Arranged: NA HH Agency: NA        Prior Living Arrangements/Services Living arrangements for the past 2 months: Single Family Home Lives with:: Self Patient language and need for interpreter reviewed:: Yes Do you feel safe going back to the place where you live?: Yes      Need for Family  Participation in Patient Care: No (Comment)     Criminal Activity/Legal Involvement Pertinent to Current Situation/Hospitalization: No - Comment as needed  Activities of Daily Living Home Assistive Devices/Equipment: CBG Meter ADL Screening (condition at time of admission) Patient's cognitive ability adequate to safely complete daily activities?: Yes Is the patient deaf or have difficulty hearing?: No Does the patient have difficulty seeing, even when wearing glasses/contacts?: No Does the patient have difficulty concentrating, remembering, or making decisions?: No Patient able to express need for assistance with ADLs?: Yes Does the patient have difficulty dressing or bathing?: No Independently performs ADLs?: Yes (appropriate for developmental age) Does the patient have difficulty walking or climbing stairs?: No Weakness of Legs: None Weakness of Arms/Hands: None  Permission Sought/Granted                  Emotional Assessment Appearance:: Appears stated age Attitude/Demeanor/Rapport: Engaged Affect (typically observed): Accepting Orientation: : Oriented to Self,Oriented to Place,Oriented to  Time,Oriented to Situation Alcohol / Substance Use: Not Applicable Psych Involvement: No (comment)  Admission diagnosis:  Rash [R21] Vasculitis (HCC) [I77.6] Patient Active Problem List   Diagnosis Date Noted  . Vasculitis (HCC) 07/07/2020  . Dehydration with hyponatremia 10/08/2018  . Hypertension   . Hypercholesteremia   . Diabetes mellitus without complication (HCC)    PCP:  Anselmo Pickler, MD Pharmacy:   [facility] [address] ([place]), [place] - [address] [place] ([place]) [place] [address] Phone: [phone] Fax: [phone]  [facility] [address] [place] [address] Phone: [phone] Fax: [phone]     Social Determinants of Health (SDOH) Interventions    Readmission Risk  Interventions No flowsheet data found.

## 2020-07-09 ENCOUNTER — Other Ambulatory Visit (HOSPITAL_COMMUNITY): Payer: Self-pay

## 2020-07-09 ENCOUNTER — Telehealth: Payer: Self-pay | Admitting: Internal Medicine

## 2020-07-09 LAB — ANA W/REFLEX IF POSITIVE: Anti Nuclear Antibody (ANA): NEGATIVE

## 2020-07-09 LAB — HEMOGLOBIN A1C
Hgb A1c MFr Bld: 12.8 % — ABNORMAL HIGH (ref 4.8–5.6)
Mean Plasma Glucose: 321 mg/dL

## 2020-07-09 LAB — ANCA TITERS
Atypical P-ANCA titer: <1:20 {titer}
C-ANCA: <1:20 {titer}
P-ANCA: <1:20 {titer}

## 2020-07-09 LAB — HEPATITIS B SURFACE ANTIBODY, QUANTITATIVE: Hep B S AB Quant (Post): 4.6 m[IU]/mL — ABNORMAL LOW (ref >9.9)

## 2020-07-09 MED ORDER — INSULIN DETEMIR 100 UNIT/ML FLEXPEN
10.0000 [IU] | Freq: Every day | SUBCUTANEOUS | 0 refills | Status: DC
  Filled 2020-07-09: qty 3, 30d supply, fill #0

## 2020-07-09 NOTE — Telephone Encounter (Signed)
Contacted patient and informed him to pick up Levemir from [facility] as clinic does not have any samples at this time. Patient was unable to afford insulin from [facility].

## 2020-07-10 ENCOUNTER — Telehealth: Payer: Self-pay | Admitting: Internal Medicine

## 2020-07-10 LAB — MPO/PR-3 (ANCA) ANTIBODIES
ANCA Proteinase 3: <3.5 U/mL (ref 0.0–3.5)
Myeloperoxidase Abs: <9 U/mL (ref 0.0–9.0)

## 2020-07-10 NOTE — Telephone Encounter (Signed)
TOC NEW HFU 07/18/2020 @ 1:15AM WITH DR LEE/CFB

## 2020-07-11 LAB — SURGICAL PATHOLOGY

## 2020-07-18 ENCOUNTER — Encounter: Payer: Self-pay | Admitting: Internal Medicine

## 2020-07-18 ENCOUNTER — Other Ambulatory Visit (HOSPITAL_COMMUNITY): Payer: Self-pay

## 2020-07-18 ENCOUNTER — Ambulatory Visit (INDEPENDENT_AMBULATORY_CARE_PROVIDER_SITE_OTHER): Payer: Self-pay | Admitting: Internal Medicine

## 2020-07-18 VITALS — BP 136/70 | HR 89 | Temp 98.2°F | Ht 74.0 in | Wt 235.7 lb

## 2020-07-18 DIAGNOSIS — I776 Arteritis, unspecified: Secondary | ICD-10-CM

## 2020-07-18 DIAGNOSIS — I10 Essential (primary) hypertension: Secondary | ICD-10-CM

## 2020-07-18 DIAGNOSIS — E119 Type 2 diabetes mellitus without complications: Secondary | ICD-10-CM

## 2020-07-18 MED ORDER — METFORMIN HCL 500 MG PO TABS
500.0000 mg | ORAL_TABLET | Freq: Every day | ORAL | 3 refills | Status: DC
  Filled 2020-07-18: qty 90, 90d supply, fill #0

## 2020-07-18 MED ORDER — METFORMIN HCL 500 MG PO TABS
500.0000 mg | ORAL_TABLET | Freq: Every day | ORAL | 3 refills | Status: AC
  Filled 2020-07-18: qty 90, 23d supply, fill #0
  Filled 2020-08-13: qty 90, 23d supply, fill #1

## 2020-07-18 MED ORDER — PIOGLITAZONE HCL 30 MG PO TABS
30.0000 mg | ORAL_TABLET | Freq: Every day | ORAL | 3 refills | Status: AC
  Filled 2020-07-18: qty 30, 30d supply, fill #0
  Filled 2020-08-13: qty 30, 30d supply, fill #1

## 2020-07-18 MED ORDER — LISINOPRIL 20 MG PO TABS
20.0000 mg | ORAL_TABLET | Freq: Every day | ORAL | 3 refills | Status: AC
  Filled 2020-07-18: qty 30, 30d supply, fill #0
  Filled 2020-08-13: qty 30, 30d supply, fill #1

## 2020-07-18 MED ORDER — INSULIN DETEMIR 100 UNIT/ML FLEXPEN
10.0000 [IU] | Freq: Every day | SUBCUTANEOUS | 2 refills | Status: AC
  Filled 2020-07-18 – 2020-08-13 (×2): qty 3, 30d supply, fill #0

## 2020-07-18 NOTE — Patient Instructions (Addendum)
Dear [name],  Thank you for allowing [place] to provide your care today. Today we discussed your diabetes and rash    I have ordered no labs for you. I will call if any are abnormal.    Today we made the following changes to your medications:    Please start metformin 500mg   Please follow-up in 4 weeks.    Please call the internal medicine center clinic if you have any questions or concerns, we may be able to help and keep you from a long and expensive emergency room wait. Our clinic and after hours phone number is [phone], the best time to call is Monday through Friday 9 am to 4 pm but there is always someone available 24/7 if you have an emergency. If you need medication refills please notify your pharmacy one week in advance and they will send Sunday a request.    If you have not gotten the COVID vaccine, I recommend doing so:  You may get it at your local [facility] or [facility] OR To schedule an appointment for a COVID vaccine or be added to the vaccine wait list: Go to [place]   OR Go to [email or web address]                  OR Call [phone]                                     OR Call [phone] and select Option 2  Thank you for choosing [facility]   Diabetes Mellitus and Nutrition, Adult When you have diabetes, or diabetes mellitus, it is very important to have healthy eating habits because your blood sugar (glucose) levels are greatly affected by what you eat and drink. Eating healthy foods in the right amounts, at about the same times every day, can help you:  Control your blood glucose.  Lower your risk of heart disease.  Improve your blood pressure.  Reach or maintain a healthy weight. What can affect my meal plan? Every person with diabetes is different, and each person has different needs for a meal plan. Your health care provider may recommend that you work with a dietitian to make a meal plan that is best for you. Your meal plan  may vary depending on factors such as:  The calories you need.  The medicines you take.  Your weight.  Your blood glucose, blood pressure, and cholesterol levels.  Your activity level.  Other health conditions you have, such as heart or kidney disease. How do carbohydrates affect me? Carbohydrates, also called carbs, affect your blood glucose level more than any other type of food. Eating carbs naturally raises the amount of glucose in your blood. Carb counting is a method for keeping track of how many carbs you eat. Counting carbs is important to keep your blood glucose at a healthy level, especially if you use insulin or take certain oral diabetes medicines. It is important to know how many carbs you can safely have in each meal. This is different for every person. Your dietitian can help you calculate how many carbs you should have at each meal and for each snack. How does alcohol affect me? Alcohol can cause a sudden decrease in blood glucose (hypoglycemia), especially if you use insulin or take certain oral diabetes medicines. Hypoglycemia can be a life-threatening condition. Symptoms of hypoglycemia, such as sleepiness, dizziness, and confusion, are similar to  symptoms of having too much alcohol.  Do not drink alcohol if: ? Your health care provider tells you not to drink. ? You are pregnant, may be pregnant, or are planning to become pregnant.  If you drink alcohol: ? Do not drink on an empty stomach. ? Limit how much you use to:  0-1 drink a day for women.  0-2 drinks a day for men. ? Be aware of how much alcohol is in your drink. In the [place] one drink equals one 12 oz bottle of beer (355 mL), one 5 oz glass of wine (148 mL), or one 1 oz glass of hard liquor (44 mL). ? Keep yourself hydrated with water, diet soda, or unsweetened iced tea.  Keep in mind that regular soda, juice, and other mixers may contain a lot of sugar and must be counted as carbs. What are tips for  following this plan? Reading food labels  Start by checking the serving size on the "Nutrition Facts" label of packaged foods and drinks. The amount of calories, carbs, fats, and other nutrients listed on the label is based on one serving of the item. Many items contain more than one serving per package.  Check the total grams (g) of carbs in one serving. You can calculate the number of servings of carbs in one serving by dividing the total carbs by 15. For example, if a food has 30 g of total carbs per serving, it would be equal to 2 servings of carbs.  Check the number of grams (g) of saturated fats and trans fats in one serving. Choose foods that have a low amount or none of these fats.  Check the number of milligrams (mg) of salt (sodium) in one serving. Most people should limit total sodium intake to less than 2,300 mg per day.  Always check the nutrition information of foods labeled as "low-fat" or "nonfat." These foods may be higher in added sugar or refined carbs and should be avoided.  Talk to your dietitian to identify your daily goals for nutrients listed on the label. Shopping  Avoid buying canned, pre-made, or processed foods. These foods tend to be high in fat, sodium, and added sugar.  Shop around the outside edge of the grocery store. This is where you will most often find fresh fruits and vegetables, bulk grains, fresh meats, and fresh dairy. Cooking  Use low-heat cooking methods, such as baking, instead of high-heat cooking methods like deep frying.  Cook using healthy oils, such as olive, canola, or sunflower oil.  Avoid cooking with butter, cream, or high-fat meats. Meal planning  Eat meals and snacks regularly, preferably at the same times every day. Avoid going long periods of time without eating.  Eat foods that are high in fiber, such as fresh fruits, vegetables, beans, and whole grains. Talk with your dietitian about how many servings of carbs you can eat at  each meal.  Eat 4-6 oz (112-168 g) of lean protein each day, such as lean meat, chicken, fish, eggs, or tofu. One ounce (oz) of lean protein is equal to: ? 1 oz (28 g) of meat, chicken, or fish. ? 1 egg. ?  cup (62 g) of tofu.  Eat some foods each day that contain healthy fats, such as avocado, nuts, seeds, and fish.   What foods should I eat? Fruits Berries. Apples. Oranges. Peaches. Apricots. Plums. Grapes. Mango. Papaya. Pomegranate. Kiwi. Cherries. Vegetables Lettuce. Spinach. Leafy greens, including kale, chard, collard greens, and mustard greens.  Beets. Cauliflower. Cabbage. Broccoli. Carrots. Green beans. Tomatoes. Peppers. Onions. Cucumbers. Brussels sprouts. Grains Whole grains, such as whole-wheat or whole-grain bread, crackers, tortillas, cereal, and pasta. Unsweetened oatmeal. Quinoa. Brown or wild rice. Meats and other proteins Seafood. Poultry without skin. Lean cuts of poultry and beef. Tofu. Nuts. Seeds. Dairy Low-fat or fat-free dairy products such as milk, yogurt, and cheese. The items listed above may not be a complete list of foods and beverages you can eat. Contact a dietitian for more information. What foods should I avoid? Fruits Fruits canned with syrup. Vegetables Canned vegetables. Frozen vegetables with butter or cream sauce. Grains Refined white flour and flour products such as bread, pasta, snack foods, and cereals. Avoid all processed foods. Meats and other proteins Fatty cuts of meat. Poultry with skin. Breaded or fried meats. Processed meat. Avoid saturated fats. Dairy Full-fat yogurt, cheese, or milk. Beverages Sweetened drinks, such as soda or iced tea. The items listed above may not be a complete list of foods and beverages you should avoid. Contact a dietitian for more information. Questions to ask a health care provider  Do I need to meet with a diabetes educator?  Do I need to meet with a dietitian?  What number can I call if I have  questions?  When are the best times to check my blood glucose? Where to find more information:  American Diabetes Association: diabetes.org  Academy of Nutrition and Dietetics: www.eatright.[employer] of Diabetes and Digestive and Kidney Diseases: CarFlippers.tn  Association of Diabetes Care and Education Specialists: www.diabeteseducator.org Summary  It is important to have healthy eating habits because your blood sugar (glucose) levels are greatly affected by what you eat and drink.  A healthy meal plan will help you control your blood glucose and maintain a healthy lifestyle.  Your health care provider may recommend that you work with a dietitian to make a meal plan that is best for you.  Keep in mind that carbohydrates (carbs) and alcohol have immediate effects on your blood glucose levels. It is important to count carbs and to use alcohol carefully. This information is not intended to replace advice given to you by your health care provider. Make sure you discuss any questions you have with your health care provider. Document Revised: 02/15/2019 Document Reviewed: 02/15/2019 Elsevier Patient Education  2021 [employer].

## 2020-07-18 NOTE — Assessment & Plan Note (Addendum)
Lab Results  Component Value Date   HGBA1C 12.8 (H) 07/07/2020   Presents w/ diagnosis of uncontrolled diabetes. Currently on glimepiride and pioglitazone. Tolerating well without side effects. Mentions being previously prescribed insulin regimen which was d/ced due to inability to afford medication. Also mentions being previously on Metformin which he stopped taking due to hearing about possible harmful side effects from [facility] on the news. Mentions having pain of his foot and complains about the slow healing of his ulcer. Have picked up the Levemir at discharge and have been taking 10 units without significant side effects. Have not yet started using his glucometer although he did pick it up from pharmacy.  A/P Present with uncontrolled diabetes. Treatment complicated by financial barriers and lack of insurance. Discussed importance of glycemic control and effect of hyperglycemia on wound healing. Also discussed safety profile for metformin and proven benefit of metformin. Carl Baker expressed understanding. - Start metformin 500mg  , titration instructions provided - D/c glimepiride to minimize hypoglycemia risk w/ initiation of insulin - C/w pioglitazone 30mg  for now - C/w insulin detemir 10 units for now (RTC w/ glucometer for dose adjustment) - RTC in 4 weeks to assess for tolerance of new regimen - F/u w/ financial counselor for orange card ( need podiatry referral for high risk foot)

## 2020-07-18 NOTE — Assessment & Plan Note (Addendum)
Carl Baker is a 60 yo M w/ PMh of uncontrolled diabetes, hypertension, hyperlipidemia presenting to [facility] for hospital follow up care and to establish care. He was recently admitted for this complaint and had undergone further work-up with screening for chronic viral hepatitis, rheumatic vasculitis as well as getting a skin biopsy. He was discharged with prednisone and has been having significant improvement in his symptoms with the prednisone course. He mentions that his presenting sxs started after taking family member's Augmentin for his sinus congestion. He denies any fevers, chills. Tolerating prednisone without significant side effects. Mentions rash has significantly improved and appear to be fading.  A/P Presenting for f/u after diagnosis of vasculitis in recent admission. Appear to be improving with prednisone course. Lab work reviewed showing + hep B surface & core antibodies but no antigen suggesting past infection. pANCA / cANCA / ANA negative. Biopsy result showing leukocytoclastic vasculitis which include a wide differential. 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 Discussed monitoring for resolution. - Allergy list updated - C/w prednisone - Return for re-evaluation if sxs reoccur

## 2020-07-18 NOTE — Assessment & Plan Note (Signed)
BP Readings from Last 3 Encounters:  07/18/20 136/70  07/08/20 136/80  06/27/20 (!) 156/93   Has hx of hypertension. On lisinopril 20mg  daily. Mentions having difficulty with refills due to financial barriers. Above goal this visit. Will re-send bp med to [facility] pharmacy for discounted price. - Refilled lisinopril 20mg  daily

## 2020-07-18 NOTE — Progress Notes (Addendum)
CC: Rash  HPI: [name] is a 60 y.o. with PMH listed below presenting with complaint of rash. Please see problem based assessment and plan for further details.  Past Medical History:  Diagnosis Date  . Diabetes mellitus without complication (HCC)   . Hypercholesteremia   . Hypertension   . Knee pain 10/09/2018  . MRSA bacteremia 10/09/2018   Family History: Brother had adrenal cancer. No other known relevant family medical history  Social History: Works in [employer]. Used to use cocaine and meth in the 80s. Used to smoke pack daily for years but quit 30 years ago ("when Carl Baker got stroke") Used to drink occasionally on weekend but 'lost the taste for it.'   Review of Systems: Review of Systems  Constitutional: Negative for chills, fever and malaise/fatigue.  Eyes: Negative for blurred vision.  Respiratory: Negative for shortness of breath.   Cardiovascular: Negative for chest pain, palpitations and leg swelling.  Gastrointestinal: Negative for constipation, diarrhea, nausea and vomiting.  Musculoskeletal: Positive for joint pain.  Neurological: Negative for dizziness and headaches.    Physical Exam: Vitals:   07/18/20 1319 07/18/20 1339  BP: (!) 147/77 136/70  Pulse: 89   Temp: 98.2 F (36.8 C)   TempSrc: Oral   SpO2: 98%   Weight: 235 lb 11.2 oz (106.9 kg)   Height: 6\' 2"  (1.88 m)    Gen: Well-developed, well nourished, NAD HEENT: NCAT head, hearing intact CV: RRR, S1, S2 normal Pulm: CTAB, No rales, no wheezes Extm: ROM intact, Peripheral pulses intact, No peripheral edema Skin: Dry, Warm, faded purpura bilateral lower extremities, Right medial foot with two ulcers with eschar base without surrounding warmth, edema or palpable fluctance     Assessment & Plan:   Vasculitis (HCC) Carl Baker is a 60 yo M w/ PMh of uncontrolled diabetes, hypertension, hyperlipidemia presenting to [facility] for hospital follow up care and to establish care. He was recently  admitted for this complaint and had undergone further work-up with screening for chronic viral hepatitis, rheumatic vasculitis as well as getting a skin biopsy. He was discharged with prednisone and has been having significant improvement in his symptoms with the prednisone course. He mentions that his presenting sxs started after taking family member's Augmentin for his sinus congestion. He denies any fevers, chills. Tolerating prednisone without significant side effects. Mentions rash has significantly improved and appear to be fading.  A/P Presenting for f/u after diagnosis of vasculitis in recent admission. Appear to be improving with prednisone course. Lab work reviewed showing + hep B surface & core antibodies but no antigen suggesting past infection. pANCA / cANCA / ANA negative. Biopsy result showing leukocytoclastic vasculitis which include a wide differential. 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 Discussed monitoring for resolution. - Allergy list updated - C/w prednisone - Return for re-evaluation if sxs reoccur  Hypertension BP Readings from Last 3 Encounters:  07/18/20 136/70  07/08/20 136/80  06/27/20 (!) 156/93   Has hx of hypertension. On lisinopril 20mg  daily. Mentions having difficulty with refills due to financial barriers. Above goal this visit. Will re-send bp med to [facility] pharmacy for discounted price. - Refilled lisinopril 20mg  daily   Diabetes mellitus without complication [facility]) Lab Results  Component Value Date   HGBA1C 12.8 (H) 07/07/2020   Presents w/ diagnosis of uncontrolled diabetes. Currently on glimepiride and pioglitazone. Tolerating well without side effects. Mentions being previously  prescribed insulin regimen which was d/ced due to inability to afford medication. Also mentions being previously on Metformin which he stopped taking due  to hearing about possible harmful side effects from [facility] on the news. Mentions having pain of his foot and complains about the slow healing of his ulcer. Have picked up the Levemir at discharge and have been taking 10 units without significant side effects. Have not yet started using his glucometer although he did pick it up from pharmacy.  A/P Present with uncontrolled diabetes. Treatment complicated by financial barriers and lack of insurance. Discussed importance of glycemic control and effect of hyperglycemia on wound healing. Also discussed safety profile for metformin and proven benefit of metformin. Carl Baker expressed understanding. - Start metformin 500mg  , titration instructions provided - D/c glimepiride to minimize hypoglycemia risk w/ initiation of insulin - C/w pioglitazone 30mg  for now - C/w insulin detemir 10 units for now (RTC w/ glucometer for dose adjustment) - RTC in 4 weeks to assess for tolerance of new regimen - F/u w/ financial counselor for orange card ( need podiatry referral for high risk foot)    Patient discussed with Dr.  - , PGY3 [facility] Internal Medicine Pager: [phone]

## 2020-07-19 ENCOUNTER — Other Ambulatory Visit (HOSPITAL_COMMUNITY): Payer: Self-pay

## 2020-07-19 NOTE — Progress Notes (Signed)
Internal Medicine Clinic Attending  Case discussed with Dr. Lee  At the time of the visit.  We reviewed the resident's history and exam and pertinent patient test results.  I agree with the assessment, diagnosis, and plan of care documented in the resident's note.    

## 2020-07-19 NOTE — Addendum Note (Signed)
Addended by: Erlinda Hong T on: 07/19/2020 09:39 AM   Modules accepted: Level of Service

## 2020-08-13 ENCOUNTER — Other Ambulatory Visit (HOSPITAL_COMMUNITY): Payer: Self-pay

## 2020-08-16 ENCOUNTER — Ambulatory Visit: Payer: Self-pay

## 2020-08-16 ENCOUNTER — Encounter: Payer: Self-pay | Admitting: Internal Medicine

## 2021-05-28 ENCOUNTER — Other Ambulatory Visit: Payer: Self-pay

## 2021-05-28 ENCOUNTER — Ambulatory Visit (INDEPENDENT_AMBULATORY_CARE_PROVIDER_SITE_OTHER): Payer: Self-pay

## 2021-05-28 ENCOUNTER — Ambulatory Visit (HOSPITAL_COMMUNITY): Payer: Self-pay

## 2021-05-28 ENCOUNTER — Ambulatory Visit (HOSPITAL_COMMUNITY)
Admission: EM | Admit: 2021-05-28 | Discharge: 2021-05-28 | Disposition: A | Discharge status: Home / Self Care | Payer: Self-pay | Attending: Nurse Practitioner | Admitting: Nurse Practitioner

## 2021-05-28 ENCOUNTER — Encounter (HOSPITAL_COMMUNITY): Payer: Self-pay

## 2021-05-28 DIAGNOSIS — M25551 Pain in right hip: Secondary | ICD-10-CM

## 2021-05-28 MED ORDER — MELOXICAM 7.5 MG PO TABS: 7.5000 mg | ORAL_TABLET | Freq: Every day | ORAL | 0 refills | Status: AC

## 2021-05-28 NOTE — ED Provider Notes (Signed)
?[place] ? ? ? ?CSN: [id number] ?Arrival date & time: 05/28/21  0802 ? ? ?  ? ?History   ?Chief Complaint ?Chief Complaint  ?Patient presents with  ? Hip Pain  ?  right  ? ? ?HPI ?Carl Baker is a 61 y.o. male.  ? ?Patient reports right posterior hip pain for the past few months. Recently worsened on the Superbowl when he slept over at his mother's house.  He did recently fall as a result of his knee buckling, however this was within the past week and the pain has not worsened since that time.  Describes pain as severe and like a knife stabbing in his buttock.  The pain is constant and does radiate down his right leg at times.  Any movement makes the pain worse.  He has tried Tylenol and leg and back pain OTC pills without any relief.  Denies weakness, decreased sensation in his toes, swelling or redness of the hip joint, and fevers.  ? ? ? ?Past Medical History:  ?Diagnosis Date  ? Diabetes mellitus without complication ([place])   ? Hypercholesteremia   ? Hypertension   ? Knee pain 10/09/2018  ? MRSA bacteremia 10/09/2018  ? ? ?Patient Active Problem List  ? Diagnosis Date Noted  ? Vasculitis ([place]) 07/07/2020  ? Hypertension   ? Hypercholesteremia   ? Diabetes mellitus without complication ([place])   ? ? ?Past Surgical History:  ?Procedure Laterality Date  ? APPENDECTOMY    ? BUBBLE STUDY  10/12/2018  ? Procedure: BUBBLE STUDY;  Surgeon: Elouise Munroe, MD;  Location: [facility];  Service: Cardiology;;  ? TEE WITHOUT CARDIOVERSION N/A 10/12/2018  ? Procedure: TRANSESOPHAGEAL ECHOCARDIOGRAM (TEE);  Surgeon: Elouise Munroe, MD;  Location: [facility];  Service: Cardiology;  Laterality: N/A;  ? WRIST FRACTURE SURGERY    ? ? ? ? ? ?Home Medications   ? ?Prior to Admission medications   ?Medication Sig Start Date End Date Taking? Authorizing Provider  ?meloxicam (MOBIC) 7.5 MG tablet Take 1 tablet (7.5 mg total) by mouth daily. 05/28/21  Yes Eulogio Bear, NP  ?blood glucose meter kit and supplies  KIT Dispense based on patient and insurance preference. Use up to four times daily as directed. (FOR ICD-9 250.00, 250.01). 10/13/18   [name], MD  ?clobetasol ointment (TEMOVATE) 9.50 % Apply 1 application topically 2 (two) times daily. 06/27/20   Volney American, PA-C  ?glimepiride (AMARYL) 4 MG tablet Take 4 mg by mouth 2 (two) times daily. 10/04/18   [provider]  ?insulin detemir (LEVEMIR) 100 unit/ml SOLN Inject 10 Units into the skin at bedtime. 07/18/20   Mosetta Anis, MD  ?Insulin Pen Needle 32G X 4 MM MISC 1 each by Does not apply route 2 (two) times a day. 10/13/18   [name], MD  ?lisinopril (ZESTRIL) 20 MG tablet Take 1 tablet (20 mg total) by mouth daily. 07/18/20   Mosetta Anis, MD  ?metFORMIN (GLUCOPHAGE) 500 MG tablet Take 1 tablet (500 mg total) by mouth daily with breakfast. Start by taking 1 tablet by mouth daily and increase as directed by 1 tablet (549m) every week until you take 2 tablets (10070m twice daily. 07/18/20 07/18/21  LeMosetta AnisMD  ?Multiple Vitamins-Minerals (CENTRUM SILVER ADULT 50+) TABS Take 1 tablet by mouth daily.    [provider]  ?pioglitazone (ACTOS) 30 MG tablet Take 1 tablet (30 mg total) by mouth daily. 07/18/20   LeMosetta Anis  MD  ? ? ?Family History ?History reviewed. No pertinent family history. ? ?Social History ?Social History  ? ?Tobacco Use  ? Smoking status: Former  ?  Types: Cigarettes  ?  Quit date: 59  ?  Years since quitting: 31.2  ? Smokeless tobacco: Never  ?Substance Use Topics  ? Alcohol use: Yes  ?  Comment: none recently; usually with buddies, maybe total of 12 since COVID started  ? Drug use: Never  ?  Types: Marijuana  ?  Comment: quit about 6-7 years ago  ? ? ? ?Allergies   ?Augmentin [amoxicillin-pot clavulanate] ? ? ?Review of Systems ?Review of Systems ?Per HPI ? ?Physical Exam ?Triage Vital Signs ?ED Triage Vitals  ?Enc Vitals Group  ?   BP 05/28/21 0824 (!) 178/113  ?   Pulse Rate 05/28/21 0824 100   ?   Resp 05/28/21 0824 20  ?   Temp 05/28/21 0824 98.1 ?F (36.7 ?C)  ?   Temp Source 05/28/21 0824 Oral  ?   SpO2 05/28/21 0824 98 %  ?   Weight --   ?   Height --   ?   Head Circumference --   ?   Peak Flow --   ?   Pain Score 05/28/21 0820 10  ?   Pain Loc --   ?   Pain Edu? --   ?   Excl. in [place]? --   ? ?No data found. ? ?Updated Vital Signs ?BP (!) 164/97 (BP Location: Left Arm) Comment: rechecked by the provider  Pulse 100   Temp 98.1 ?F (36.7 ?C) (Oral)   Resp 20   SpO2 98%  ? ?Visual Acuity ?Right Eye Distance:   ?Left Eye Distance:   ?Bilateral Distance:   ? ?Right Eye Near:   ?Left Eye Near:    ?Bilateral Near:    ? ?Physical Exam ?Vitals and nursing note reviewed.  ?Constitutional:   ?   General: He is not in acute distress. ?   Appearance: Normal appearance. He is not toxic-appearing.  ?Pulmonary:  ?   Effort: Pulmonary effort is normal. No respiratory distress.  ?Musculoskeletal:  ?   Right hip: Tenderness present. No bony tenderness. Normal range of motion. Normal strength.  ?   Left hip: Normal.  ?     Legs: ? ?   Comments: Tenderness in area marked  ?Skin: ?   General: Skin is warm and dry.  ?   Capillary Refill: Capillary refill takes less than 2 seconds.  ?   Coloration: Skin is not jaundiced or pale.  ?   Findings: No erythema.  ?Neurological:  ?   Mental Status: He is alert and oriented to person, place, and time.  ?   Gait: Gait abnormal (walks with cane).  ? ? ? ?UC Treatments / Results  ?Labs ?(all labs ordered are listed, but only abnormal results are displayed) ?Labs Reviewed - No data to display ? ?EKG ? ? ?Radiology ?DG Hip Unilat With Pelvis 2-3 Views Right ? ?Result Date: 05/28/2021 ?CLINICAL DATA:  Right hip pain worsening over the last 2 weeks EXAM: DG HIP (WITH OR WITHOUT PELVIS) 2-3V RIGHT COMPARISON:  None. FINDINGS: There is no acute fracture or dislocation. Femoroacetabular alignment is normal. There is mild joint space narrowing bilaterally with minimal associated osteophytosis.  The SI joints and symphysis pubis are intact. The soft tissues are unremarkable. IMPRESSION: Mild degenerative changes of both hips. Electronically Signed   By: Valetta Mole  M.D.   On: 05/28/2021 09:04   ? ?Procedures ?Procedures (including critical care time) ? ?Medications Ordered in UC ?Medications - No data to display ? ?Initial Impression / Assessment and Plan / UC Course  ?I have reviewed the triage vital signs and the nursing notes. ? ?Pertinent labs & imaging results that were available during my care of the patient were reviewed by me and considered in my medical decision making (see chart for details). ? ?  ?X-ray shows no acute fracture or dislocation; there does appear to be some osteoarthritis.  Start meloxicam 7.5 mg daily and follow up with primary care provider in the next week to determine if Physical Therapy may be a good option.  ? ?He does report he has been out of all of his medications because he has not seen his primary care provider in ~1 year and he has run out of money and does not have insurance.  This is likely attributing to his elevated BP today in office along with the pain.  Since he has a follow up scheduled with his PCP within the next week, will defer refills to them.  ?Final Clinical Impressions(s) / UC Diagnoses  ? ?Final diagnoses:  ?Right hip pain  ? ? ? ?Discharge Instructions   ? ?  ?Please start the meloxicam 7.5 mg daily for arthritis pain.  Please follow up with your primary care provider within the next week to discuss your medications and see if PT would be a good option for you. ? ? ? ? ?ED Prescriptions   ? ? Medication Sig Dispense Auth. Provider  ? meloxicam (MOBIC) 7.5 MG tablet Take 1 tablet (7.5 mg total) by mouth daily. 30 tablet Eulogio Bear, NP  ? ?  ? ?PDMP not reviewed this encounter. ?  ?Eulogio Bear, NP ?05/28/21 [phone] ? ?

## 2021-05-28 NOTE — ED Triage Notes (Signed)
Pt reports ongoing right hip pain that has worsened within the last two weeks. He also fell 4 days ago, "kicking right foot behind" him causing a sudden onset of pain and swelling. Hip pain radiates into his right buttock extending into the lateral aspect of his thigh before stopping at the knee. Has been taking tylenol w/o relief. ?Pain is interfering with his sleep. No LOC with fall.  ?

## 2021-05-28 NOTE — Discharge Instructions (Addendum)
Please start the meloxicam 7.5 mg daily for arthritis pain.  Please follow up with your primary care provider within the next week to discuss your medications and see if PT would be a good option for you. ?

## 2021-07-22 ENCOUNTER — Emergency Department (HOSPITAL_COMMUNITY)
Admission: EM | Admit: 2021-07-22 | Discharge: 2021-07-23 | Discharge status: Against Medical Advice | Payer: Self-pay | Attending: Emergency Medicine | Admitting: Emergency Medicine

## 2021-07-22 ENCOUNTER — Emergency Department (HOSPITAL_COMMUNITY): Payer: Self-pay

## 2021-07-22 ENCOUNTER — Encounter (HOSPITAL_COMMUNITY): Payer: Self-pay | Admitting: Emergency Medicine

## 2021-07-22 ENCOUNTER — Other Ambulatory Visit: Payer: Self-pay

## 2021-07-22 DIAGNOSIS — M25561 Pain in right knee: Secondary | ICD-10-CM | POA: Insufficient documentation

## 2021-07-22 DIAGNOSIS — M25551 Pain in right hip: Secondary | ICD-10-CM | POA: Insufficient documentation

## 2021-07-22 DIAGNOSIS — Z5321 Procedure and treatment not carried out due to patient leaving prior to being seen by health care provider: Secondary | ICD-10-CM | POA: Insufficient documentation

## 2021-07-22 NOTE — ED Provider Triage Note (Signed)
Emergency Medicine Provider Triage Evaluation Note ? ?Carl Baker , a 61 y.o. male  was evaluated in triage.  Pt complains of numerous complaints.  Patient states that before the Super Bowl night he fell and began experiencing right hip and knee pain.  Patient states that since then, for 4 months, he has had twitching in his right thigh.  The patient is demanding an MRI, states that he was sent here for an MRI by his PCP.  The patient does not have any kind of documentation to support this.  Patient denies any recent fevers, nausea, vomiting, chest pain, shortness of breath, loss of ability to ambulate. ? ?Review of Systems  ?Positive:  ?Negative:  ? ?Physical Exam  ?BP (!) 158/90   Pulse (!) 116   Temp 98.6 ?F (37 ?C) (Oral)   Resp 16   SpO2 100%  ?Gen:   Awake, no distress   ?Resp:  Normal effort  ?MSK:   Moves extremities without difficulty  ?Other:   ? ?Medical Decision Making  ?Medically screening exam initiated at 5:53 PM.  Appropriate orders placed.  Carl Baker was informed that the remainder of the evaluation will be completed by another provider, this initial triage assessment does not replace that evaluation, and the importance of remaining in the ED until their evaluation is complete. ? ? ?  ?Al Decant, PA-C ?07/22/21 1754 ? ?

## 2021-07-22 NOTE — ED Triage Notes (Signed)
Pt presents with right leg /hip pain. States he had fallen a few weeks ago and went to urgent care but states they told him he has arthritis in his hip.  Feels he needs an MRI and that no one has evaluated his thigh/knee, and leg.  Ambulating with a crutch on arrival.  Pt has not been able to drive he states d/t not being able to bend that right leg.  ?

## 2021-07-23 NOTE — ED Notes (Signed)
X3 no response 

## 2023-12-13 IMAGING — DX DG KNEE COMPLETE 4+V*R*
4 series · 4 of 4 positions shown · non-contrast
Comparison: 10/10/2018

CLINICAL DATA: Right knee pain.  Recent fall

EXAM:
RIGHT KNEE - COMPLETE 4+ VIEW

[knee ap]
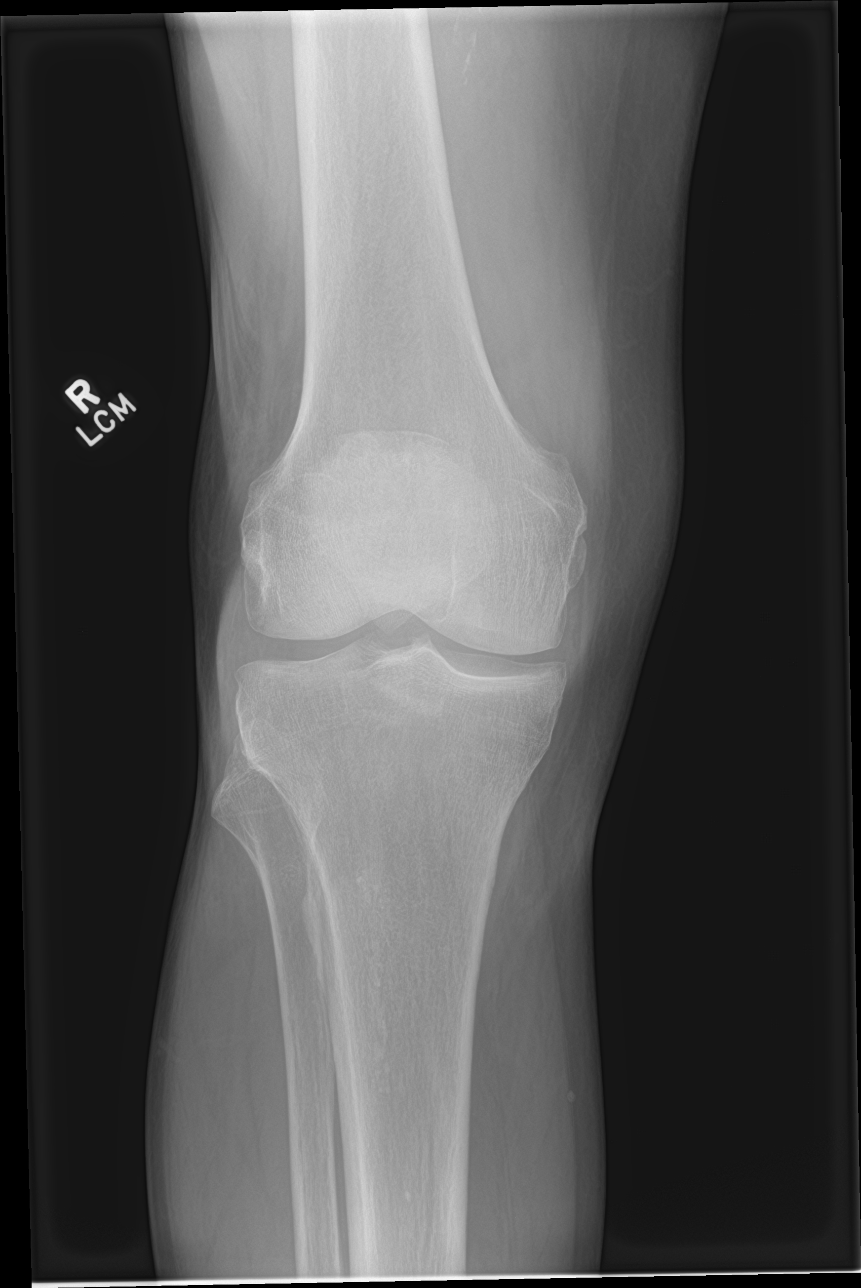

[knee lat]
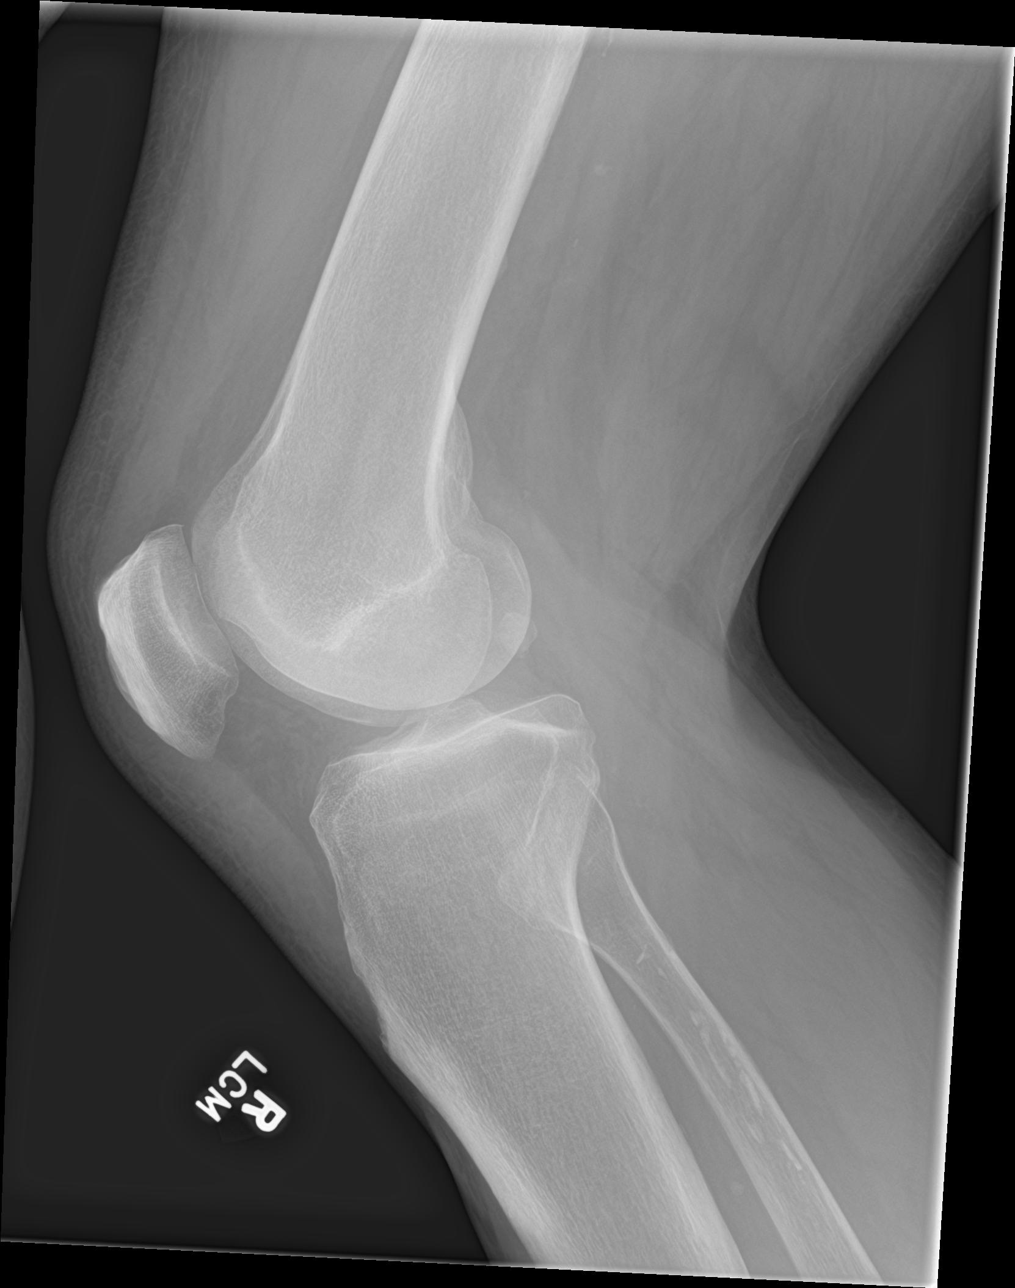

[knee obl (1 of 2)]
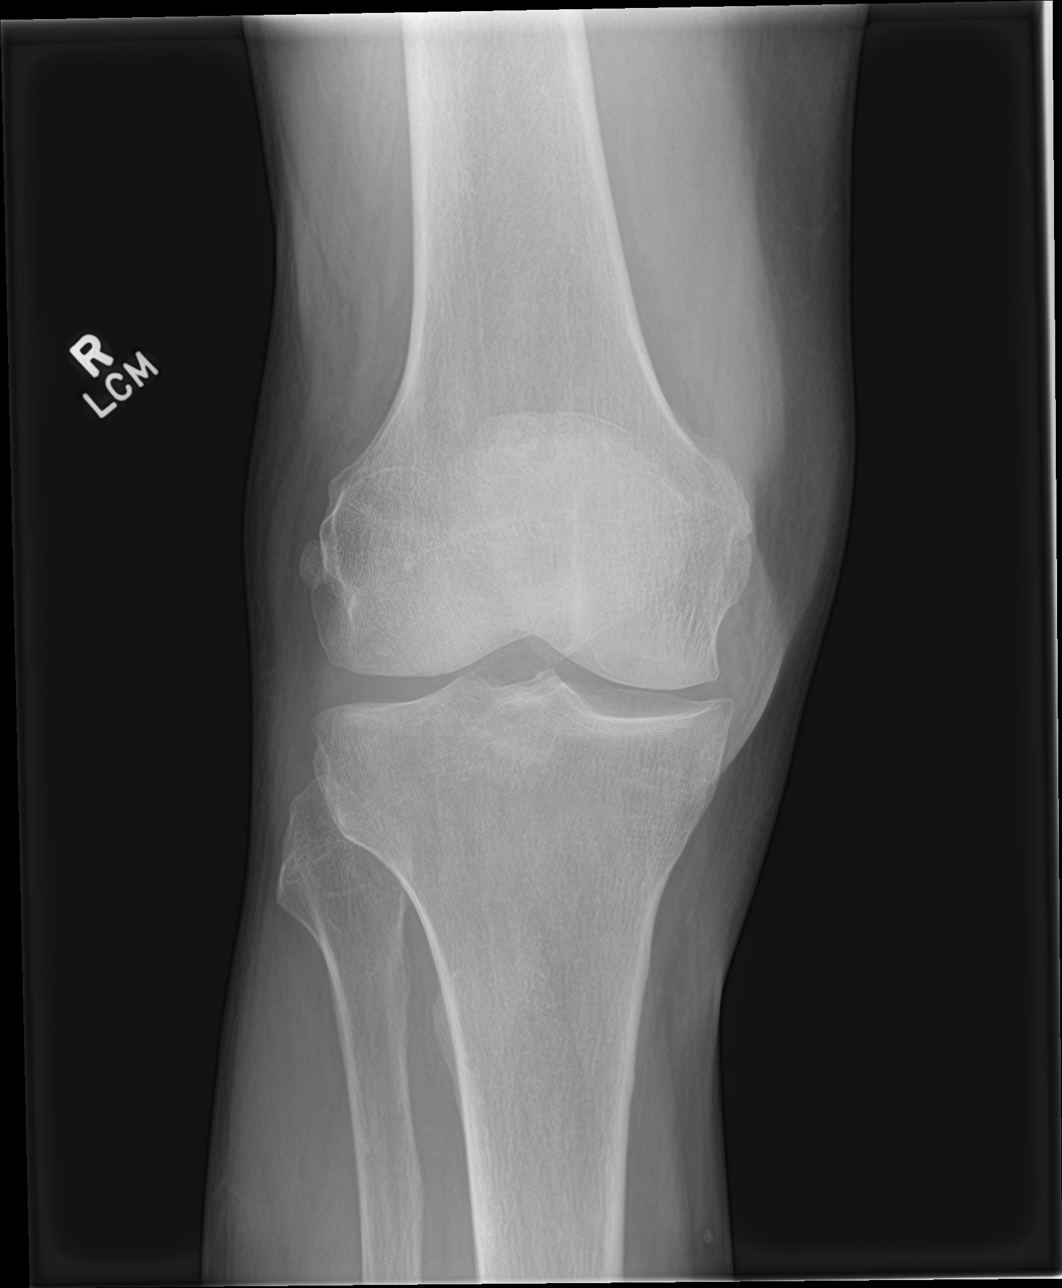

[knee obl (2 of 2)]
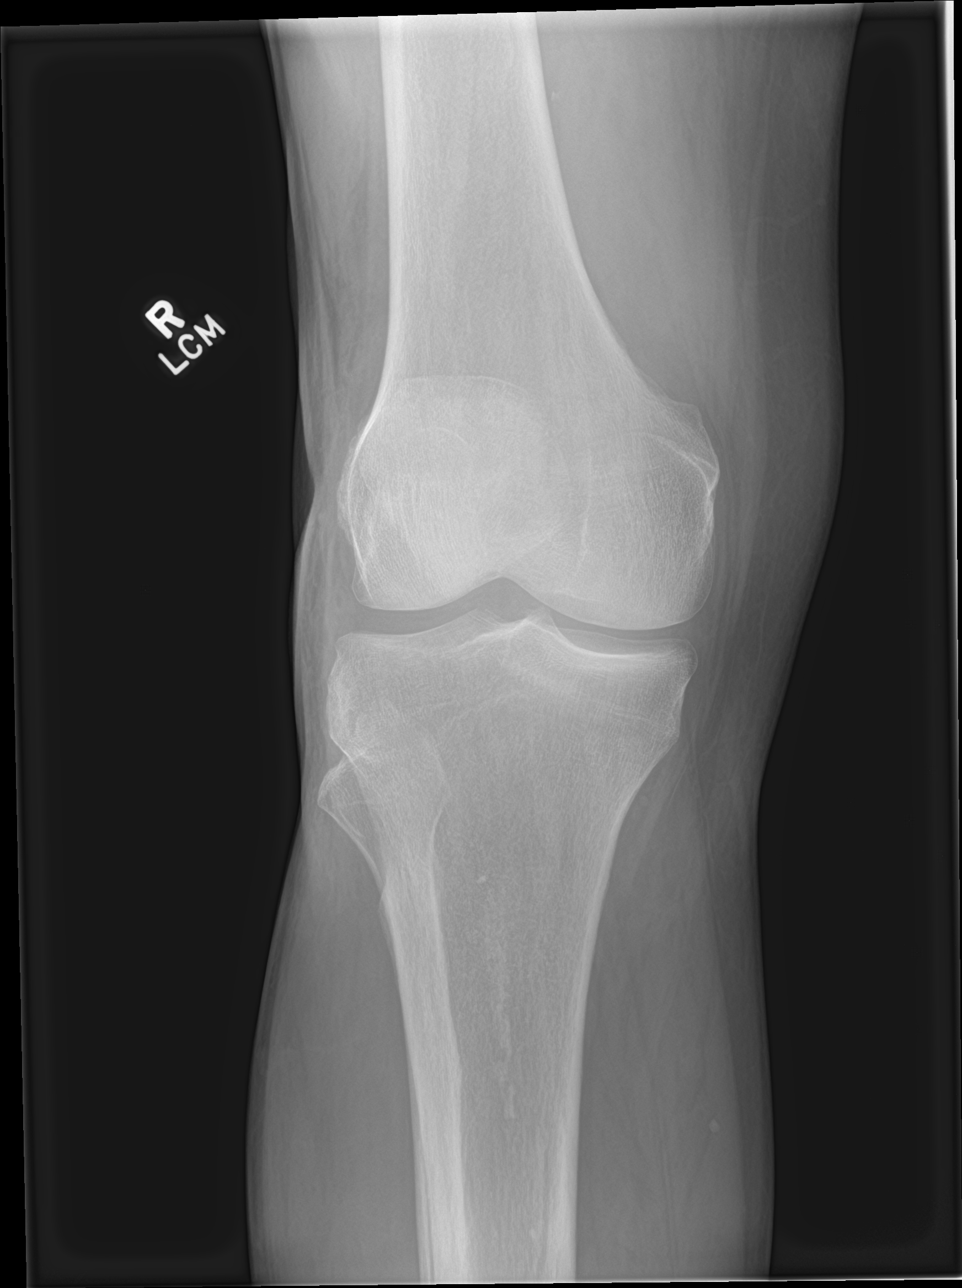

[4 of 4 positions shown; findings below may reference images not displayed]

FINDINGS: No evidence of fracture, dislocation, or joint effusion. No evidence
of arthropathy or other focal bone abnormality. Mild soft tissue
swelling. Scattered atherosclerotic vascular calcification.
IMPRESSION: Mild soft tissue swelling. No acute osseous abnormality, right knee.
